# Patient Record
Sex: Female | Born: 1935 | Race: White | Hispanic: No | Smoking: Never smoker
Health system: Southern US, Community
[De-identification: ages and names within clinical notes are randomized; demographics above are authoritative.]

## PROBLEM LIST (undated history)

## (undated) DIAGNOSIS — M8580 Other specified disorders of bone density and structure, unspecified site: Secondary | ICD-10-CM

## (undated) DIAGNOSIS — E785 Hyperlipidemia, unspecified: Secondary | ICD-10-CM

## (undated) DIAGNOSIS — S22000A Wedge compression fracture of unspecified thoracic vertebra, initial encounter for closed fracture: Secondary | ICD-10-CM

## (undated) DIAGNOSIS — F0390 Unspecified dementia without behavioral disturbance: Secondary | ICD-10-CM

## (undated) DIAGNOSIS — C801 Malignant (primary) neoplasm, unspecified: Secondary | ICD-10-CM

## (undated) DIAGNOSIS — K8689 Other specified diseases of pancreas: Secondary | ICD-10-CM

## (undated) DIAGNOSIS — C4490 Unspecified malignant neoplasm of skin, unspecified: Secondary | ICD-10-CM

## (undated) DIAGNOSIS — I10 Essential (primary) hypertension: Secondary | ICD-10-CM

## (undated) HISTORY — PX: ABDOMINAL HYSTERECTOMY: SHX81

---

## 1997-11-27 ENCOUNTER — Other Ambulatory Visit: Admission: RE | Admit: 1997-11-27 | Discharge: 1997-11-27 | Payer: Self-pay | Admitting: *Deleted

## 1997-12-11 ENCOUNTER — Other Ambulatory Visit: Admission: RE | Admit: 1997-12-11 | Discharge: 1997-12-11 | Payer: Self-pay | Admitting: *Deleted

## 1997-12-13 ENCOUNTER — Other Ambulatory Visit: Admission: RE | Admit: 1997-12-13 | Discharge: 1997-12-13 | Payer: Self-pay | Admitting: *Deleted

## 1998-02-25 ENCOUNTER — Other Ambulatory Visit: Admission: RE | Admit: 1998-02-25 | Discharge: 1998-02-25 | Payer: Self-pay | Admitting: *Deleted

## 1998-08-22 ENCOUNTER — Other Ambulatory Visit: Admission: RE | Admit: 1998-08-22 | Discharge: 1998-08-22 | Payer: Self-pay | Admitting: *Deleted

## 1999-11-19 ENCOUNTER — Other Ambulatory Visit: Admission: RE | Admit: 1999-11-19 | Discharge: 1999-11-19 | Payer: Self-pay | Admitting: *Deleted

## 2000-07-05 ENCOUNTER — Encounter: Payer: Self-pay | Admitting: Internal Medicine

## 2000-07-05 ENCOUNTER — Ambulatory Visit (HOSPITAL_COMMUNITY): Admission: RE | Admit: 2000-07-05 | Discharge: 2000-07-05 | Payer: Self-pay | Admitting: Internal Medicine

## 2000-08-25 ENCOUNTER — Encounter (INDEPENDENT_AMBULATORY_CARE_PROVIDER_SITE_OTHER): Payer: Self-pay

## 2000-08-25 ENCOUNTER — Other Ambulatory Visit: Admission: RE | Admit: 2000-08-25 | Discharge: 2000-08-25 | Payer: Self-pay | Admitting: *Deleted

## 2000-09-28 ENCOUNTER — Encounter (INDEPENDENT_AMBULATORY_CARE_PROVIDER_SITE_OTHER): Payer: Self-pay | Admitting: Specialist

## 2000-09-28 ENCOUNTER — Encounter (INDEPENDENT_AMBULATORY_CARE_PROVIDER_SITE_OTHER): Payer: Self-pay

## 2000-09-28 ENCOUNTER — Inpatient Hospital Stay (HOSPITAL_COMMUNITY): Admission: RE | Admit: 2000-09-28 | Discharge: 2000-10-02 | Payer: Self-pay | Admitting: *Deleted

## 2001-02-01 ENCOUNTER — Other Ambulatory Visit: Admission: RE | Admit: 2001-02-01 | Discharge: 2001-02-01 | Payer: Self-pay | Admitting: *Deleted

## 2001-02-02 ENCOUNTER — Encounter: Admission: RE | Admit: 2001-02-02 | Discharge: 2001-02-02 | Payer: Self-pay | Admitting: *Deleted

## 2001-04-19 ENCOUNTER — Other Ambulatory Visit: Admission: RE | Admit: 2001-04-19 | Discharge: 2001-04-19 | Payer: Self-pay | Admitting: *Deleted

## 2001-04-26 ENCOUNTER — Encounter: Admission: RE | Admit: 2001-04-26 | Discharge: 2001-04-26 | Payer: Self-pay | Admitting: *Deleted

## 2001-07-28 ENCOUNTER — Other Ambulatory Visit: Admission: RE | Admit: 2001-07-28 | Discharge: 2001-07-28 | Payer: Self-pay | Admitting: *Deleted

## 2001-10-14 ENCOUNTER — Other Ambulatory Visit: Admission: RE | Admit: 2001-10-14 | Discharge: 2001-10-14 | Payer: Self-pay | Admitting: *Deleted

## 2002-04-12 ENCOUNTER — Other Ambulatory Visit: Admission: RE | Admit: 2002-04-12 | Discharge: 2002-04-12 | Payer: Self-pay | Admitting: *Deleted

## 2002-08-14 ENCOUNTER — Other Ambulatory Visit: Admission: RE | Admit: 2002-08-14 | Discharge: 2002-08-14 | Payer: Self-pay | Admitting: *Deleted

## 2003-02-27 ENCOUNTER — Other Ambulatory Visit: Admission: RE | Admit: 2003-02-27 | Discharge: 2003-02-27 | Payer: Self-pay | Admitting: *Deleted

## 2003-02-27 ENCOUNTER — Encounter: Admission: RE | Admit: 2003-02-27 | Discharge: 2003-02-27 | Payer: Self-pay | Admitting: *Deleted

## 2003-05-22 ENCOUNTER — Ambulatory Visit (HOSPITAL_COMMUNITY): Admission: RE | Admit: 2003-05-22 | Discharge: 2003-05-22 | Payer: Self-pay | Admitting: Gastroenterology

## 2003-09-04 ENCOUNTER — Other Ambulatory Visit: Admission: RE | Admit: 2003-09-04 | Discharge: 2003-09-04 | Payer: Self-pay | Admitting: *Deleted

## 2003-11-09 ENCOUNTER — Inpatient Hospital Stay (HOSPITAL_COMMUNITY): Admission: EM | Admit: 2003-11-09 | Discharge: 2003-11-12 | Payer: Self-pay | Admitting: Emergency Medicine

## 2004-10-08 ENCOUNTER — Inpatient Hospital Stay (HOSPITAL_COMMUNITY): Admission: EM | Admit: 2004-10-08 | Discharge: 2004-10-16 | Payer: Self-pay | Admitting: Emergency Medicine

## 2006-06-01 ENCOUNTER — Encounter: Admission: RE | Admit: 2006-06-01 | Discharge: 2006-06-01 | Payer: Self-pay | Admitting: Internal Medicine

## 2006-07-15 ENCOUNTER — Encounter: Admission: RE | Admit: 2006-07-15 | Discharge: 2006-07-15 | Payer: Self-pay | Admitting: Internal Medicine

## 2007-03-15 ENCOUNTER — Ambulatory Visit: Admission: RE | Admit: 2007-03-15 | Discharge: 2007-05-25 | Payer: Self-pay | Admitting: Radiation Oncology

## 2007-08-04 ENCOUNTER — Inpatient Hospital Stay (HOSPITAL_COMMUNITY): Admission: EM | Admit: 2007-08-04 | Discharge: 2007-08-08 | Payer: Self-pay | Admitting: Emergency Medicine

## 2008-01-22 ENCOUNTER — Emergency Department (HOSPITAL_COMMUNITY): Admission: EM | Admit: 2008-01-22 | Discharge: 2008-01-22 | Payer: Self-pay | Admitting: Emergency Medicine

## 2008-01-22 DIAGNOSIS — S22000A Wedge compression fracture of unspecified thoracic vertebra, initial encounter for closed fracture: Secondary | ICD-10-CM

## 2008-01-22 HISTORY — DX: Wedge compression fracture of unspecified thoracic vertebra, initial encounter for closed fracture: S22.000A

## 2008-02-01 ENCOUNTER — Inpatient Hospital Stay (HOSPITAL_COMMUNITY): Admission: EM | Admit: 2008-02-01 | Discharge: 2008-02-10 | Payer: Self-pay | Admitting: Internal Medicine

## 2008-02-01 ENCOUNTER — Encounter: Payer: Self-pay | Admitting: Emergency Medicine

## 2008-02-02 ENCOUNTER — Encounter (INDEPENDENT_AMBULATORY_CARE_PROVIDER_SITE_OTHER): Payer: Self-pay | Admitting: Internal Medicine

## 2008-03-28 ENCOUNTER — Encounter: Admission: RE | Admit: 2008-03-28 | Discharge: 2008-03-28 | Payer: Self-pay | Admitting: Neurosurgery

## 2008-05-23 ENCOUNTER — Encounter: Admission: RE | Admit: 2008-05-23 | Discharge: 2008-05-23 | Payer: Self-pay | Admitting: Neurosurgery

## 2010-12-23 NOTE — Discharge Summary (Signed)
NAME:  [name] [name], [name] [name]                  ACCOUNT NO.:  [id number]   MEDICAL RECORD NO.:  [id number]          PATIENT TYPE:  INP   LOCATION:  1401                         FACILITY:  [facility]   PHYSICIAN:  Gardiner Barefoot, MD    DATE OF BIRTH:  January 08, 1915   DATE OF ADMISSION:  08/04/2007  DATE OF DISCHARGE:                               DISCHARGE SUMMARY   Date of discharge is yet to be determined.   DISCHARGE DIAGNOSES:  1. Epigastric pain.  2. Right leg abrasion.  3. Chronic obstructive pulmonary disease.  4. Dementia.  5. Hypertension.  6. Hypercholesterolemia.  7. Diverticulosis.   DISCHARGE MEDICATIONS:  1. Atenolol 1 tablet 50 mg p.o. daily.  2. Norvasc 5 mg daily.  3. Lovastatin 20 mg daily.  4. Calcium 600 mg daily.  5. Vitamin C 500 mg daily.  6. Zinc 25 mg daily.  7. Temazepam 30 mg p.o. nightly p.r.n.  8. Aldara 5% cream to forehead every other night.  9. Fluocinonide ointment/cream a one-to-one mix topically to legs      daily.  10.Triamsinolone 1%/ 120 g, apply to arms daily.   HISTORY OF PRESENT ILLNESS:  Please see dictated history and physical  from admitting physician.  Briefly, this 77 year old female who had  presented with squeezing epigastric pain that had lasted approximately 1  hour.  The patient reported she had never had that before, and it was  associated with nausea and vomiting.  The pain had resolved  spontaneously.  She never had the pain before.  There was no fever other  associated symptoms.   HOSPITAL COURSE:  1. Epigastric pain.  The patient was evaluated with a CT scan, which      did not suggest any etiology of the epigastric pain.  She also had      initial cardiac enzymes in the emergency room with the troponin at      0.04 and a CK-MB of 10.5 with a CK of 326.  Cardiology saw the      patient in the emergency room, and did not feel any intervention      was necessary at that time.  The patient's cardiac enzymes were      cycled, and  troponin remained within normal limits, 0.04, as well      as CK-MB which rose to 13.5, however, was trending down at      discharge.  Discussed with the patient's son at length the findings      including therapy for cardiac disease which includes the      medications she is on including atenolol, aspirin, and Zocor, and      that she should be continued with that.  As the patient did not      have any EKG changes or further chest pain, or for dysrhythmias      during her hospitalization. no indication for further management.      The patient's son was advised will to medically continue to manage      the patient with blood pressure medications, aspirin and lipid  therapy.  Due to the patient's age and comorbid dementia, do not      think the patient would benefit from cardiac catheterization.  Feel      the risk would out-weigh the benefit, and therefore, no stress test      was undertaken.  However, this can be readdressed if the patient      does have a return of any chest pain or any other concerns.  The      patient denied ever having any chest pain during the      hospitalization or before.  The patient does though report of a      history of occasional pain under her left breast that lasted a few      seconds and was sharp.  This does not suggest a cardiac etiology.  2. Hypothyroidism.  The patient's TSH was checked and was 2.093, and      the patient was continued on her home medications.  3. Hypertension.  The patient was continued on her home medications,      and would have a mildly elevated blood pressure in the a.m. prior      to her a.m. pain medication doses, but otherwise was normotensive.  4. Dyslipidemia.  The patient's fasting lipid profile was checked.      Her LDL was 83, with an HDL of 75, and triglycerides of 43.  She is      at goal.  5. A fall.  The patient did have a fall during her hospitalization,      and had a small abrasion of her right shin at the site  of where she      had a blood blister.  The patient did have some bleeding, required      a dressing on her lesion.  She likely will need dressings over the      next 1-2 weeks at her assisted living location; we will have that      arranged prior to discharge.  6. Dermatitis.  The patient was continued with her home topical      therapy.  7. History of endometrial cancer.  The patient is status post a total      abdominal hysterectomy and oophorectomy.      Gardiner Barefoot, MD  Electronically Signed     RWC/MEDQ  D:  08/07/2007  T:  08/08/2007  Job:  [id number]

## 2010-12-23 NOTE — H&P (Signed)
NAME:  [name] [name]                  ACCOUNT NO.:  [id number]   MEDICAL RECORD NO.:  [id number]          PATIENT TYPE:  EMS   LOCATION:  ED                           FACILITY:  [facility]   PHYSICIAN:  [name] [name], M.D.DATE OF BIRTH:  1915/04/03   DATE OF ADMISSION:  08/04/2007  DATE OF DISCHARGE:                              HISTORY & PHYSICAL   HISTORY OF PRESENT ILLNESS:  This is a 77 year old very pleasant lady  who had an episode of epigastric squeezing type of pain which lasted 1  hour earlier today, approximately 12 hours ago.  The pain was associated  with nausea and vomiting and according to the family members some  confusion.  After the pain had resolved.  She still continued to have  some nausea and therefore was eventually brought to the emergency room.  She has no history of coronary artery disease nor has she had a stroke.   PAST SURGICAL HISTORY:  Total abdominal hysterectomy and bi salpingo-  oophorectomy in 2002 for endometrial cancer.  She has a history of  melanoma, which has been locally excised on the nose and forehead.   PAST MEDICAL HISTORY:  Right pubic ramus fracture March 2006,  hypertension, hypercholesterolemia on medication, chronic obstructive  pulmonary disease without need for oxygen, diverticular disease on  colonoscopy in 2004.   SOCIAL HISTORY:  She is a widow and now lives at a friend's home [place].  She is a nonsmoker.  She occasionally drinks alcohol.   MEDICATIONS:  Atenolol 50 mg daily, lovastatin 20 mg daily, Norvasc 5 mg  daily, Bactrim recently started by her dermatologist 1 tablet  twice a  day for further 5 days, Temazepam 30 mg at bedtime, and zinc sulfate 1  tablet daily.   ALLERGIES:  Actonel, Celebrex, Fosamax, Niacin and Quinidine.   FAMILY HISTORY:  Noncontributory.   REVIEW OF SYSTEMS:  Apart from the symptoms mentioned above there are no  other symptoms referable to all systems reviewed.   PHYSICAL EXAMINATION:  VITAL SIGNS:   Temperature 98.2, blood pressure  120/57, pulse 73, respiratory rate 12 to 14, saturation 96%.  GENERAL:  She looks systemically well and has not had any acute distress  or pain at the present time.  CARDIOVASCULAR:  Heart sounds are present  and normal without any murmurs or gallop rhythm.  RESPIRATORY:  Lung fields are clear with poor air entry in both lung  fields but there is no wheezing or crackles heard.  ABDOMEN:  Abdomen is soft but it is tender in the epigastric area and  there is a sense of fullness in this area.  There is no  hepatosplenomegaly.  NEUROLOGICAL:  She is alert and oriented with no focal neurological  signs.  She does not appear to be confused at the present time.   LABORATORY DATA:  Hemoglobin 13.4, white blood cell count 10.0,  platelets 249.  Sodium 131, potassium 4.3, bicarbonate 28, glucose 112,  BUN 14, creatinine 0.98,  lipase 22.  Cardiac enzymes show an elevated  CK of 326 with an elevated CK-MB of 10.5.  Troponin is 0.04.  Electrocardiogram shows normal sinus rhythm and no acute ST-T wave  changes.  Chest x-ray shows chronic obstructive pulmonary disease with  no acute cardiopulmonary disease.   IMPRESSION:  1. Epigastric pain, etiology unclear, possibly cardiac, possibly      related to stomach pathology.  2. Elevated CK-MB, unclear significance.  3. Hypertension.  4. Chronic obstructive pulmonary disease, stable.  5. Hypercholesterolemia.  6. Possible mild underlying dementia per family's history.   PLAN:  1. Admit.  2. CT of the abdomen and pelvis.  3. Serial cardiac enzymes.  4. Cardiology has been consulted and they will see the patient.  5. Further recommendations will depend on the patient's hospital      progress.      [name] [name], M.D.  Electronically Signed     NCG/MEDQ  D:  08/04/2007  T:  08/04/2007  Job:  [id number]   cc:   Candyce Churn. Allyne Gee, M.D.  Fax: [phone]

## 2010-12-23 NOTE — Discharge Summary (Signed)
NAME:  [name] [name], [name] [name]                  ACCOUNT NO.:  [id number]   MEDICAL RECORD NO.:  [id number]         PATIENT TYPE:  LINP   LOCATION:  1418                         FACILITY:  [facility]   PHYSICIAN:  Hind I Elsaid, MD      DATE OF BIRTH:  Dec 27, 1914   DATE OF ADMISSION:  02/01/2008  DATE OF DISCHARGE:                               DISCHARGE SUMMARY   DISCHARGE DIAGNOSES:  1. Unstable acute T11 vertebral body fracture.  2. Bilateral moderate pleural effusion, felt to be secondary to      congestive heart failure, resolved.  3. Hypertension.  4. Chronic obstructive pulmonary disease.  5. E. coli urinary tract infection.  6. Leukocytosis.  7. Abnormal CT abdominal findings with possibility of mucinous tumor      of the body of the pancreas.  8. Constipation, resolved.  9. Hyperlipidemia.  10.Dementia.   MEDICATIONS:  To be dictated at the date of discharge.   CONSULTATIONS:  1. Neurosurgery consulted, done by Dr. Phoebe Perch from [facility].  2. Gastroenterology consulted for abnormal CT abdomen, done by Dr.      Jeani Hawking.  3. Physical therapy, speech and swallow also consulted for evaluations      of her feeding.   PROCEDURE:  1. CT abdomen and pelvis did show acute fracture of T11 vertebral      body.  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      Slight dilatation of the bile duct is without a particular site of      obstruction.  Extensive stool in the colon.  Moderate bilateral      effusion.  No acute abnormality of the pelvis or pelvic fracture.  2. Chest x-ray:  Layering bilateral effusion with overlying      atelectasis.  3. X-ray of the hip negative for fracture.  4. MRI of the thoracic spine:  Acute T11 vertebral body fracture.  The      fracture plane is filled with blood products.  Recommend MRI.      Acute T11 vertebral body fracture.  There is mild posterior      angulation and displacement of the posterior vertebral body  fragment with blood product in the fracture cleft.  Diffuse      thoracic interbody spine and fusion is seen, which could reflect      diffuse idiopathic skeletal hyperostosis.  Recommend neurosurgery      orthopedics for evaluation.  Moderate bilateral pleural effusion.  5. Ultrasound of the abdomen:  Abnormal appearance of the gallbladder.      A 1 cm pancreatic polycystic structure.  Bilateral pleural      effusion, small amount of ascites.  6. Chest x-ray:  COPD and improved aeration with a mild degree of      pleural effusion.  Coarse interstitial opacities are chronic.  The      heart is normal.  7. A 2D echo:  Ejection fraction 60-65%.  There were no left      ventricular regional wall  motion abnormalities.Left ventricular end      diastolic function was normal.   HISTORY OF PRESENT ILLNESS:  This is a 77 year old female with a history  of hypertension, hyperlipidemia, who had a fall 7-10 days in a  restaurant.  Complained of low back pain.  Apparently, she had an x-ray  outpatient, and they did not recommend any acute fracture.  She came to  the emergency room, found to have a CT of the abdomen and pelvis, which  showed evidence of acute T11 compression fracture along with moderate  bilateral effusion and __________ over the pancreas.  Patient was  admitted for further evaluation and pain management.   PROBLEMS:  1. Patient admitted with intractable back pain, acute compression      fracture over the vertebra.  Patient was started on Dilaudid for      pain control and oxycodone IR.  The patient had a CT scan of the T-      spine, which , as above.  For that, neurosurgery consultant by Dr.      Phoebe Perch.  He recommended complete immobilization of the spine for 4-      8 weeks and recommended head of bed 0-10 only but may reverse      Trendelenburg bed slightly, plus log-roll only.  He recommended      decubitus and DVT prophylaxis.  The immobilization team will      continue for  4 to8 weeks.  He recommended a repeat CT of the      thoracic spine within 6-8 weeks to reassess, and he will follow up      as an outpatient her CT scan.  It is a really very difficult      situation this unlucky patient went through.  Accordingly, patient      remained under above immobilization precautions.  The feeding      remained an issue secondary to very high risk of aspiration.  On      close discussion to the family, the family agreed with comfort      feedings, despite the risk of aspiration.  For that, swallow and      speech did evaluate the patient.  Family agrees with comfort p.o.      intake, only when asking for p.o. and when fully alert.  As said,      the patient did not receive the goal of p.o. feeding secondary to      really high risk of aspiration.  Our plan is to reach to the final      recommendation from swallow and speech.  Possible multiple attempts      to feed snacks during the day when the patient is awake and alert.      In addition to that, will ask Dr. Phoebe Perch to evaluate the patient,      and if there is any further recommendation to be addressed before      the patient is discharged to a nursing home.  2. Bilateral moderate pleural effusion:  Patient is started on IV      Lasix with good urine output.  Repeat chest x-ray did show complete      resolution of her moderate bilateral effusion.  At this time, the      patient did not require any thoracentesis.  The patient breathing      above 90, 4% on room air, and she did not require any form of  noninvasive oxygen measures.  3. E. coli UTI:  Patient on day 3 of Rocephin.  4. Constipation, which has resolved with laxative measures.  5. Leukocytosis:  Remains fluctuating from the date of admission but      the number remains stable.  We felt that can be followed as an      outpatient.  6. Elevated D-dimer:  The patient is not orthostatic, in distress, or      shortness of breath.  The patient has no  symptoms of chest pain at      this time.  We felt the high D-dimer is most probably secondary to      the fracture.  Less likely secondary to pulmonary embolism and      secondary to the patient's immobilization.  We did not pursue any      CT angio at this time.  7. Abnormal pancreatic mass:  Dr. Elnoria Howard did evaluate the patient and      recommended outpatient followup after resolution of her acute      problem.  Her family informed about this.  Also, Dr. Elnoria Howard did not      think there was any rush to perform above endoscopic ultrasound      with FNA.  He recommended outpatient followup within three months.   DISPOSITION:  Patient will be discharged to a nursing home.  We will ask  Dr. Phoebe Perch to re-evaluate the patient again tomorrow for his final  recommendation.  We will wait for the final swallow and speech  recommendations.  The family informed about the complications of  immobilization, which include pneumonia, DVT, PE, and decubitus ulcer.      Hind Bosie Helper, MD  Electronically Signed    HIE/MEDQ  D:  02/07/2008  T:  02/07/2008  Job:  [id number]

## 2010-12-23 NOTE — Consult Note (Signed)
NAME:  [name] [name], [name] [name]                  ACCOUNT NO.:  [id number]   MEDICAL RECORD NO.:  [id number]          PATIENT TYPE:  INP   LOCATION:  1418                         FACILITY:  [facility]   PHYSICIAN:  Jordan Hawks. Elnoria Howard, MD    DATE OF BIRTH:  November 08, 1914   DATE OF CONSULTATION:  02/07/2008  DATE OF DISCHARGE:                                 CONSULTATION   REASON FOR CONSULTATION:  Possible pancreatic neoplasm.   REFERRING PHYSICIAN:  [facility].  This is an unassigned  patient.   HISTORY OF PRESENT ILLNESS:  This is a 77 year old female with past  medical history of hypertension, hyperlipidemia, COPD, dermatitis and  dementia who was admitted to the hospital with a T11 fracture.  The  patient apparently suffered a fall 7-10 days prior to her admission and  complained of having of persistent back pain.  However, x-ray studies  were not indicative of any fracture at that time.  However, because of  her repeated visits to the ER, a CT scan was performed, and it was noted  that she had a T11 fracture.  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  The patient did complain of having some epigastric pain with some  radiation tobacco.  Otherwise, prior to her fall, she had no complaints  of any types of pain, and per the family's report, she was asymptomatic.   PAST MEDICAL AND SURGICAL HISTORY:  As stated above.   FAMILY HISTORY:  Noncontributory.   SOCIAL HISTORY:  Negative for alcohol, tobacco, illicit drug use.  She  lives in a nursing home.   ALLERGIES:  TO QUININE, NIACIN, VERAPAMIL, VICODIN, MORPHINE, SENOKOT,  CELEBREX, MASOPROCOL.   HOSPITAL MEDICATIONS:  1. Vitamin C.  2. Atenolol.  3. Ceftriaxone.  4. Lovenox.  5. Lasix.  6. Protonix.  7. Zocor.  8. Zinc sulfate.  9. Ventolin.  10.Percocet.  11.Darvocet.   REVIEW OF SYSTEMS:  Unable to obtain as she is having some altered  mental status  secondary to the pain medications.   PHYSICAL EXAMINATION:  VITAL SIGNS:  Blood pressure is 142/73, heart  rate is 68, respirations 20, temperature is 97.4.  GENERAL:  The patient appears not in any acute distress; however,  cognitively she appears to be affected from the pain medications.  HEENT:  Normocephalic, atraumatic.  NECK:  Appears to be supple.  No lymphadenopathy.  LUNGS:  Clear to auscultation bilaterally.  CARDIOVASCULAR:  Regular rhythm.  ABDOMEN:  Is flat, soft, some epigastric tenderness.  No rebound or  rigidity.  Positive bowel sounds.  EXTREMITIES:  No clubbing, cyanosis or edema.   LABORATORY VALUES:  White blood cell count 14.2, hemoglobin 14.6,  platelets 295.  Sodium 136, potassium 3.7, chloride 96, CO2 27, glucose  113, BUN 28, creatinine 0.8.   IMPRESSION:  1. 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  2. T11 fracture.   I have discussed the situation in detail with her family, and at this  time, no acute GI intervention is required as she has an  unstable T11  fracture.  Apparently, she is not amendable to having surgical repair of  this issue, and therefore, bedrest has been ordered, and hopefully, her  vertebrae can heal to some extent.  There is also talk of using a back  brace.  Unfortunately, she does have some epigastric tenderness, and I  was wondering if an EUS with FNA will need to be done sooner rather  later.  However, at this time, I will have the patient follow-up with me  in 3 months.  However, further systems can be rendered if deemed  necessary, and my business card was provided to the family.  Additionally, if the patient is able to be stabilized with a back brace,  then EUS can be done at a sooner date.  At this time, I will hold off on  any GI intervention and can be called as needed.      Jordan Hawks Elnoria Howard, MD  Electronically Signed     PDH/MEDQ  D:  02/07/2008  T:  02/07/2008  Job:  [id number]

## 2010-12-23 NOTE — Discharge Summary (Signed)
NAME:  [name] [name], [name] [name]                  ACCOUNT NO.:  [id number]   MEDICAL RECORD NO.:  [id number]          PATIENT TYPE:  INP   LOCATION:  1401                         [facility]ACILITY:  [facility]   PHYSICIAN:  Lucita Ferrara, MD         DATE OF BIRTH:  04-20-1915   DATE OF ADMISSION:  08/04/2007  DATE OF DISCHARGE:                               DISCHARGE SUMMARY   ADDENDUM:  I am going to go ahead and discontinue her Lovastatin given  the increase in CK and her being on this medicine.   ISSUES TO BE ADDRESSED:  1. Patient is to follow up the CKs to make sure that they are trending      down.  Currently, it is trending down here.  2. She is to follow up in regards to her cognitive decline which has      been moderate at this point and puts her at risk for      a.     Falls.      b.     Aspiration.   As she transitions to the skilled nursing facility at [facility], she should be on observation for fall precautions,  aspirations precautions.  Head of the bed should be at 45 degrees and  also the bed should be molded.  She should have weekly CK levels and  this information should be given to Murtis Sink or the physician at  the skilled nursing facility.  I had a long discussion with the family  in regards to perhaps starting Aricept for her cognitive decline which  she has never been on. This is probably a good recommendation, but needs  to be monitored as far as side effects and gastrointestinal side effects  especially.      Lucita Ferrara, MD  Electronically Signed     RR/MEDQ  D:  08/08/2007  T:  08/08/2007  Job:  [id number]   cc:   Murtis Sink, M.D.  [place], [place]

## 2010-12-23 NOTE — H&P (Signed)
NAME:  [name] [name], [name] [name]                  ACCOUNT NO.:  [id number]   MEDICAL RECORD NO.:  [id number]          PATIENT TYPE:  INP   LOCATION:  1418                         FACILITY:  [facility]   PHYSICIAN:  Eduard Clos, MDDATE OF BIRTH:  1915-03-21   DATE OF ADMISSION:  02/01/2008  DATE OF DISCHARGE:                              HISTORY & PHYSICAL   History obtained from patient, physician, and patient's daughter.   CHIEF COMPLAINT:  Mid back pain.   HISTORY OF PRESENT ILLNESS:  This is a 77 year old female with history  of hypertension, dermatitis, hyperlipidemia, COPD who presented to the  ER with complaints of increasing mid back pain.  The patient had a fall  7-10 days ago in a restaurant, whereon she was taken to the ER.  At that  time the patient had complained of low back pain and x-rays of her  lumbosacral spine were done and once pain was controlled and x-rays  showing no acute factors, was discharged back to assisted living  facility.  After being discharged she started feeling the same again on  the second day.  The patient was placed on Vicodin since discharge from  the ER.  The Vicodin was continued, but despite great pain, was  increasing at this time.  The pain was more in the mid back.  At the  first,per patient's daughter, the patient had x-ray of the whole spine.  This did not show any acute fracture.  The patient was continued on pain  medications, despite which the patient did not improve, and at this time  the patient also was found to be constipated and the patient was  transferred to the ER.  At the ER the patient had a CAT scan of the  abdomen and pelvis, which showed an acute T-11 compression fracture  along with moderate bilateral pleural effusion and mucinous tumor of the  pancreas.  The patient was admitted for further evaluation and  management.  The patient still has some pain in the mid back area which  is controlled largely with her medication.  The  patient has not moved  her bowels for some days now.  She told that she was given some enema at  the  with nonpainful results.  In addition the patient was also found to  be hypoxic at this time.  She is being maintained at 35% Venti mask for  maintaining more than 90.  The patient denies any chest pain, shortness  of breath,  dizziness, loss of function, weakness of limbs.   PAST MEDICAL HISTORY:  1. Hypertension.  2. Hyperlipidemia.  3. Dermatitis.  4. COPD.  5. Dementia.   PAST SURGICAL HISTORY:  She has had a hysterectomy.   MEDICATIONS PRIOR TO ADMISSION:  Per daughter:  1. Atenolol 75 mg.  2. Norvasc was discontinued.  3. She takes two creams, one for her hand and another one for her leg.      The one for her hand, per the patient's daughter was triamcinolone.  4. She also takes Zocor.  5. Vitamin C.  6. Zinc.  ALLERGIES:  1. The patient is allergic to quinidine.  2. QUININE.  3. NIACIN.  4. VERAPAMIL.  5. MASOPROCOL.  6. VICODIN.  7. MORPHINE SULFATE.  8. SENOKOT.  9. CELEBREX.   SOCIAL HISTORY:  The patient did not smoke cigarettes, drinks alcohol  socially.   FAMILY HISTORY:  Noncontributory.   REVIEW OF SYSTEMS:  As in history of present illness.   PHYSICAL EXAMINATION:  GENERAL:  Not in acute distress.  VITAL SIGNS:  Blood pressure 150/70, pulse 80 per minute, temperature  97.4, respirations 18 per minute, O2 saturation 94% on 35% mask.  HEENT:  Anicteric, no pallor.  CHEST:  Bilaterally clear to auscultation, no rhonchi, no crepitation.  HEART:  S1 and S2 heard.  ABDOMEN:  Soft, mildly distended.  No guarding normal active bowel  sounds.  NEUROLOGIC:  Alert, awake, oriented to time, place, and person.  Moves  upper and lower extremities.  EXTREMITIES:  Peripheral pulses felt.  The patient on trying to move,  she has symptoms of pain in the mid back.  At rest her pain is largely  controlled.   LABORATORY DATA:  CT of abdomen and pelvis shows active  fracture of T11  vertebral body.  There is a 13 mm mucinous tumor of the body of the  pancreas, slight dilatation of the bile ducts without any discreet site  of obstruction.  Extensive tumor in the colon.  Moderate bilateral  pleural effusion.   CBC:  WBC is 14.4, hemoglobin 15.5, hematocrit 45.5, platelets 233,  neutrophils 85%.  Complete metabolic panel:  Sodium 138, potassium 4,  chloride 88, bicarbonate 31, glucose 135, BUN 14, creatinine 0.7.  Alkaline phosphatase 147, total bilirubin 1.2, AST 56, ALT 31, total  protein 7.4, albumin 4.4, calcium 9.7, lipase 52.  UA:  Positive  ketones, negative for nitrites, leukocytes, blood, and bilirubin.  Glucose levels are negative.   ASSESSMENT:  1. Mid back pain with acute T11 compression fracture.  2. Mucinous tumor of the pancreas.  3. Hyponatremia, probably from fluid overload.  4. Moderate bilateral pleural effusion.  5. Constipation.  6. Chronic obstructive pulmonary disease.  7. Hypertension.  8. Dementia.   PLAN:  Admit patient to telemetry.  Will check BNP and D-dimer.  Will  get an MRI of the thoracic spine.  Will place patient on IV Lasix due to  bilateral pleural effusion.  Will recheck a complete metabolic panel,  CBC, TSH,  and lipid panel in the a.m.  The patient will need a neuroradiologist  for her T11 compression fracture evaluation and also a GI evaluation for  a mucinous tumor of the pancreas.  I have discussed with the patient's  daughter in detail about the patient's condition and plan.  Further  recommendations as the patient condition evolves.      Eduard Clos, MD  Electronically Signed     ANK/MEDQ  D:  02/01/2008  T:  02/02/2008  Job:  [id number]

## 2010-12-23 NOTE — Discharge Summary (Signed)
NAME:  [name] [name], [name] [name]                  ACCOUNT NO.:  [id number]   MEDICAL RECORD NO.:  [id number]          PATIENT TYPE:  INP   LOCATION:  1418                         FACILITY:  [facility]   PHYSICIAN:  Isidor Holts, M.D.  DATE OF BIRTH:  1914/08/30   DATE OF ADMISSION:  02/01/2008  DATE OF DISCHARGE:  02/10/2008                               DISCHARGE SUMMARY   ADDENDUM:   PRIMARY MEDICAL DOCTOR:  Robyn N. Allyne Gee, M.D.   DISCHARGE DIAGNOSES:  Refer to interim Discharge Summary dictated February 07, 2008 by Dr. Suzzanne Cloud.   CONSULTATIONS:  1. Clydene Fake, M.D., Neurosurgeon.  2. Jordan Hawks. Elnoria Howard, MD. GI.   For procedures, admission history, detailed clinical course, refer to  above-mentioned interim summary.   DISCHARGE MEDICATIONS:  1. Tenormin 25 mg p.o. daily.  2. Zocor 40 mg p.o. q.h.s.  3. Ascorbic acid 500 mg p.o. daily.  4. Triamcinolone 0.1% cream applied topically to affected areas of the      skin once daily.  5. Zinc sulfate 220 mg p.o. daily.  6. Protonix 40 mg p.o. daily.  7. Lexapro 10 mg p.o. daily.  8. Lovenox 40 mg subcutaneously daily.  9. Albuterol 2.5 mg /Atrovent 500 mcg bronchodilator nebulizers p.r.n.      q.4-6 hours.  10.Darvocet N 100 one p.o. p.r.n. q.6 hourly.   DISPOSITION:  For the period from February 08, 2008 to February 09, 2008, the  patient's clinical condition remained stable and pain was well-  controlled.  She had no clinical evidence of congestive heart failure  and, as a matter of fact, chest x-ray of February 02, 2008 showed COPD, but  no current pneumonia or congestive heart failure.  There was a bump in  renal indices, with BUN of 56 and creatinine of 1.08 on February 09, 2008.  Diuretics have therefore been discontinued  and the patient was  commenced on gentle intravenous fluid hydration with half normal saline.  She was on day #5 of intravenous Rocephin on February 09, 2008, and the plan  is to discontinue this on February 10, 2008 after completion of  a 6-day  course.  There have been no problems referable to the patient's  pancreatic lesion.  She is scheduled to followup with Dr. Jeani Hawking  in 3 months' time, on an outpatient basis.  The patient has tolerated a  D1 diet with nectar-thick liquids and has been asymptomatic from the  point of view of COPD.  Blood pressure was adequately controlled at  106/71 on February 09, 2008.  I did have an extensive discussion with Dr.  Phoebe Perch, Neurosurgeon, on February 09, 2008.  He has recommended that the  patient remain on bedrest for a period of 3 to 6 months.  He  emphatically does not recommend a back brace. He would like to see the  patient again in 6 to 8 weeks with repeat CT scan, to reevaluate.  At  this point, the patient is considered clinically stable for discharge to  be contemplated, and provided no acute problems arise in the interim,  she will be discharged on February 14, 2008.   DIET:  D1 dysphagia diet with nectar-thick liquids.   ACTIVITY:  Bed rest for 3 to 6 months.  May assume Trendelenburg  position.  Otherwise, head of bed elevation no higher than 0 to 10  degrees.   FOLLOW-UP INSTRUCTIONS:  The patient is to followup routinely with her  primary MD, Dr. Dorothyann Peng per prior scheduled appointment.  She is  also to followup with Dr. Phoebe Perch, Neurosurgeon in 6 to 8 weeks.  According to Dr. Phoebe Perch, an appointment has already been scheduled.  She  is to have repeat CT scan of her thoracic spine just prior to that  appointment.  In addition, the patient should follow up with Dr. Jeani Hawking, Gastroenterologist, in 3 months.  Her family has been supplied  with the appropriate information.      Isidor Holts, M.D.  Electronically Signed     CO/MEDQ  D:  02/09/2008  T:  02/09/2008  Job:  [id number]

## 2010-12-26 NOTE — Discharge Summary (Signed)
[facility]  Patient:    [name] [name], [name] [name]                 MRN: [id number] Adm. Date:  [id number] Disc. Date: [id number] Attending:  Marin Comment CC:         [name] [name], M.D.   Discharge Summary  REASON FOR ADMISSION:  Endometriosis carcinoma for total abdominal hysterectomy and bilateral salpingo-oophorectomy.  DISCHARGE DIAGNOSIS:  Stage IB adenocarcinoma of the endometrium.  OPERATIVE PROCEDURES: 1. Total abdominal hysterectomy. 2. Bilateral salpingo-oophorectomy. 3. Exploratory laparotomy.  CONDITION AT THE TIME OF DISCHARGE:  Stable and improved.  HISTORY OF PRESENT ILLNESS:  For details of the patients admission history and physical, please see the transcribed note dated September 28, 2000. Briefly, this patient is 77 years old.  She has never been on hormone therapy. She presented to our office in referral from [name] [name], M.D., because of a single episode of postmenopausal bleeding.  Endometrial biopsy showed fragments of atypical papillary epithelium felt to be consistent with carcinoma.  She was placed on Megace and her bleeding stopped.  She is now admitted for hysterectomy.  HOSPITAL COURSE:  [name] [name] received a full bowel prep on the night before her surgery and was admitted on the day of surgery and taken to the operating room where exploratory laparotomy, total abdominal hysterectomy, and bilateral salpingo-oophorectomy were performed.  The frozen section diagnosis on the patients operative specimen showed an endometrial carcinoma which was felt to be grade 3, but less than 50% myometrial invasion.  Peritoneal washings were obtained at the time of the procedure.  The patients estimated blood loss was 400 cc.  On the evening of surgery, the patient was doing well.  Her pain was controlled with PCA morphine.  She was groggy, but conversant.  On postoperative day #1, the patients urine output was sluggish.  It was  also dark in color and remained dark yellow through the remainder of her hospitalization even after diuresis began.  On the morning of postoperative day #1, the patients Foley catheter was removed.  She was urged to ambulate. At that time, her bowel sounds were already active and hemoglobin was stable at 12.4.  She was begun on sips of liquid.  She had some mild nausea and remained just on small sips of water during this stay.  That evening, she got very little sleep and on the morning of postoperative day #2 had the blues. She felt primarily that this was due to her lack of sleep.  She tried some clear liquids, but continued to have nausea.  On that day, her abdomen was slightly distended with hyperactive bowel sounds.  She increased her ambulation.  I was contacted that day with complaints of sluggish urine output.  She was catheterized and did not have a high residual.  We saturation tight in terms of her fluid status and she began to pick up her urine output on her own.  On the evening of postoperative day #2, the patient had marked increase in her blood pressure.  Her blood pressures had been normotensive and even low on the hours of vital signs leading up to this time.  The values were 118/59 and 133/66.  On the evening in which I was contacted, her blood pressure was 200/110.  At that time, she received a single dose of labetalol and then a dose of hydrochlorothiazide.  Her propranolol was given approximately an hour after the labetalol was given.  Her blood pressure  was normal on the morning following this episode and her blood pressure medications were able to be resumed.  Her temperature maximum for postoperative day #2 was 100.2 degrees, but this was the only temperature over 100 degrees during her hospitalization.  On the day of postoperative day #3, she began to take a full liquid diet and this was tolerated well.  She was advanced to a bland diet and then a regular diet on the  morning of postoperative day #4.  On the evening of postoperative day #3, she had a normal bowel movement and another one on postoperative day #4.  Since discontinuing her morphine, she has not been using anything for pain medication.  She has been using her spirometer well.  On the morning of postoperative day #4, the patient was sitting up, had lipstick on, and had eaten a regular breakfast with bacon and eggs.  Urine output was picking up with 1200 cc over the last shift.  Her examination showed a very modest amount of staining on her abdominal dressing.  Her abdomen was still hyperactive.  The incision otherwise looked good.  The hemoglobin was stable at 11.9.  A decision was made to ask the patient to eat lunch and if she tolerated her lunch well to be discharged.  She received a [employer] [facility] discharge instruction sheet, which I reviewed with her carefully.  She will come to my office on Monday to have her staples removed. Her daughter-in-law is a Orthoptist at [employer]. [facility]. She will be given a staple remover and if her daughter-in-law was to remove her staples rather than having her come to the office, that is fine too.  CONDITION AT THE TIME OF DISCHARGE:  Stable and improved. DD:  10/02/00 TD:  10/04/00 Job: [id number]

## 2010-12-26 NOTE — Discharge Summary (Signed)
NAME:  [name], [name]                  ACCOUNT NO.:  [id number]   MEDICAL RECORD NO.:  [id number]          PATIENT TYPE:  INP   LOCATION:  0454                         FACILITY:  [facility]   PHYSICIAN:  Hettie Holstein, D.O.    DATE OF BIRTH:  Mar 07, 1915   DATE OF ADMISSION:  10/08/2004  DATE OF DISCHARGE:                                 DISCHARGE SUMMARY   ADMISSION DIAGNOSIS:  Acute pelvic fracture, status post fall.   DISCHARGE DIAGNOSES:  1.  Acute right pubic ramus fracture, status post evaluation of Dr. Turner Daniels of      orthopedics with a recommendation for walker and weightbearing as      tolerated and use of a walker for the next six weeks.  2.  Hypertension, controlled.  3.  Chronic obstructive pulmonary disease, stable.  4.  Osteoporosis.  5.  Hyponatremia, felt to be secondary to hydrochlorothiazide in addition to      pain associated with hip fracture.  6.  Mildly elevated liver function tests.  The patient is on Crestor at      home.  Will check an ultrasound prior to discharge.  If this cannot be      coordinated prior to discharge, then recommend that this be done in the      outpatient setting.  We are holding a request at this time.  Recommend      follow-up LFTs within the next couple of weeks.   MEDICATIONS ON TRANSFER:  Patient should continue her calcium supplement 600  mg b.i.d. as before, vitamin C daily, in addition to her Prevacid as before,  which is 30 mg daily.  Hold her HCTZ and triamterene for now.  Continue her  atenolol 25 mg daily.  Continue prophylaxis until she is more mobile and  ambulatory with Lovenox 30 mg subcu every 24 hours and calcitonin 1 spray in  alternating nostrils daily, Senokot twice daily for laxative but hold if the  development of diarrhea, laxative 1-2 tablets q.i.d. p.r.n. pain.   HISTORY OF PRESENT ILLNESS:  For full details, please see H&P as dictated by  Dr. Corky Downs; however, [name] [name] is a pleasant 77 year old Caucasian female  with a  history of hypertension and hypercholesterolemia with an accidental  fall on Monday, in which she tripped over carpet while going to the store.  She developed pain on the right side of her pelvis and was taken to the  urgent care center.  She had an x-ray of the pelvis, which did not show any  conclusive fracture.  She later saw Dr. Turner Daniels in the office on Tuesday, and  she was scheduled to have an MRI of the pelvis previously.  Unfortunately,  she could not keep the appointment because she started throwing up after  taking Vicodin.  She could not keep anything down; hence, she presented to  the emergency department for evaluation.  She had experienced hallucinations  while taking Vicodin and was given morphine in the emergency department and  had a brief episode of confusion.   HOSPITAL COURSE:  Patient was admitted  for pain control and management.  Tramadol was attempted for pain management.  In addition, she was evaluated  by Dr. Turner Daniels.  She was noted to have a right pubic ramus fracture and  recommendations for walker use and skilled nursing facility placement or  assisted living per orthopedic service in addition to therapy services here  at [facility].  She was noted to have some mildly elevated LFTs.  It was  noted  that she had been on Crestor in the past.  Please refer to the dictation  above.  We are ordering an ultrasound and holding her hydrochlorothiazide in  regards to her hyponatremia at this time.  We are awaiting her final  disposition pending bed availability.      ESS/MEDQ  D:  10/13/2004  T:  10/13/2004  Job:  [id number]   cc:   Dr. Arlyce Dice

## 2010-12-26 NOTE — Discharge Summary (Signed)
NAME:  [name] [name], [name] [name]                            ACCOUNT NO.:  [id number]   MEDICAL RECORD NO.:  [id number]                   PATIENT TYPE:  INP   LOCATION:  5009                                 FACILITY:  [facility]   PHYSICIAN:  Della Goo, M.D.              DATE OF BIRTH:  1914/09/07   DATE OF ADMISSION:  11/09/2003  DATE OF DISCHARGE:  11/12/2003                                 DISCHARGE SUMMARY   DISCHARGE DIAGNOSES:  1. Syncope, resolved.  2. Bradycardia, resolved.  3. Debility, improved.  4. Hypertension.   HOSPITAL COURSE:  PROBLEM 1.  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  She had not complained of chest pain or  shortness of breath, nor any other symptoms.  Inpatient evaluation did  reveal a significant bradycardia with a heart rate in the 40s and 50s.  She  had been on Inderal prior to this admission.  Inderal was adjusted to a  lower dose and upon discharge her heart rate stabilized in the 70s and 80s.  It was also suspected that dehydration may have contributed to her symptoms,  as she has had poor oral intake since the death of her husband several  months ago.   PROBLEM 2.  Debility - [name] [name] has had poor oral intake over the past  several months.  It was also noted that she has episodes of confusion,  especially in the evening.  This was attributed to sundowning.  She was  started on Remeron 15 mg p.o. q.d., which may improve her sundowning  symptoms as well as her appetite.  Physical therapy evaluation was obtained  during this admission.  Evaluation revealed that she is actually doing quite  well and does not need any further physical therapy upon discharge.   PROBLEM 3.  Anemia of chronic disease - [name] [name] will have further anemia  evaluation as an outpatient.  She was placed on Trinsicon during this  admission.   DISPOSITION UPON DISCHARGE:  Ms.  [name]'s oral intake has increased.  She had  no further syncopal episodes and her bradycardia had resolved with a lower  dose of Inderal.   DISCHARGE MEDICATIONS:  1. Protonix 40 mg p.o. q.d.  2. Potassium 10 mEq p.o. q.d.  3. Trinsicon one tablet p.o. b.i.d.  4. Remeron 15 mg p.o. q.d.  5. Inderal 10 mg p.o. b.i.d.  6. HCTZ 12.5 mg p.o. q.d.  7. Ensure pudding three times a day.  8. Tylenol one or two p.o. q.4h. p.r.n. for pain.   FOLLOW UP:  [name] [name] will be seen by Dr. Allyne Gee on Monday, November 26, 2003  at 11 a.m.      Merlene Laughter. Renae Gloss, M.D.  Della Goo, M.D.    KRS/MEDQ  D:  11/12/2003  T:  11/13/2003  Job:  [id number]

## 2010-12-26 NOTE — Op Note (Signed)
[facility]  Patient:    [name] [name], [name] [name]                 MRN: [id number] Proc. Date: 09/28/00 Adm. Date:  [id number] Attending:  Marin Comment CC:         Dorothyann Peng, M.D., [id number] [place], [place] [address]  Hayes Ludwig, N.P.   Operative Report  PREOPERATIVE DIAGNOSIS:  Endometrial biopsy showing papillary atypical endometrium highly suggestive of endometrial cancer.  POSTOPERATIVE DIAGNOSES: 1. Stage IB, grade III adenocarcinoma of the endometrium.  No evidence of    distal spread. 2. Uterine myomas. 3. Left ovarian fibroma.  OPERATION: 1. Exploratory laparotomy. 2. Total abdominal hysterectomy. 3. Bilateral salpingo-oophorectomy.  SURGEON:  Pershing Cox, M.D.  ASSISTANT:  Hayes Ludwig, N.P.  ANESTHESIA:  General endotracheal anesthesia.  INDICATIONS:  This patient is 77 years old.  She presented to [facility] in referral from Dr. Asa Saunas because of postmenopausal bleeding. Endometrial biopsy was performed in the office by Hayes Ludwig.  This showed a typical papillary epithelium consistent with carcinoma.  After preoperative evaluation by her primary physician, Dr. Allyne Gee, she underwent Cardiolite testing by Dr. Lenise Herald.  This showed a normal perfusion study.  With that information, she is brought to the operating room today for a total abdominal hysterectomy, bilateral salpingo-oophorectomy.  Because of the patients age, determination had been made prior to surgery that, regardless of the depth of invasion, that periaortic nodes would not be performed unless there were palpable nodes on her examination.  OPERATIVE FINDINGS:  There was no evidence of ascites, no evidence of peritoneal implants.  There was a small, less than 2 mm, surface implant on the left lobe of the liver.  The left ovary had a 2 cm firm nodule consistent with a fibroma.  There was a pedunculated myoma on the fundus  of the uterus. The right ovary was atrophic.  The uterus itself was approximately 8 weeks in size.  DESCRIPTION OF PROCEDURE:  [name] [name] was brought to the operating room with an IV in place.  She had received a gram of Ancef in the holding area.  PAS stockings were placed on her lower extremities.  She was placed supine on the OR table, and general endotracheal anesthesia was administered.  During this preliminary portion of the operation, every attempt was made to keep her warm. A Bair Hugger was placed immediately upon intubation.  The patient was placed in frog leg position, and the anterior abdominal wall, perineum, and vagina were prepped with a solution of Hibiclens.  A Foley catheter was sterilely inserted into the bladder and left to drain.  The patient was placed again in supine position and draped for a midline incision.  The periumbilical midline incision was marked with a marking pen.  Marcaine 0.25% was injected into the skin edges and subcutaneous tissue, delivering a total volume of 20 cc of 0.25% Marcaine.  Scalpel was used to incise the lower two-thirds of the incision as it lay below the umbilicus.  Subcutaneous tissues were divided with blunt dissection and cautery until the fascia was exposed.  The fascia was exposed by knife.  Fascial incision was extended superiorly and inferiorly with Mayo scissors.  Pyramidalis was used direct [place] to the midline.  The muscle edges were spread.  Peritoneum was identified and tented and opened atraumatically.  There was no evidence of ascites.  We palpated the lateral edge of the peritoneum, and there was no  evidence of implants.  With the peritoneum open, 500 cc of warm saline was instilled in the peritoneal cavity and then collected; 400 cc were collected.  The peritoneal incision was then extended superiorly and inferiorly to the dome of the bladder.  Moist laps were used to protect the lateral walls of the incision, and the  smallest Buchwalter retractor were used to retract the lateral skin edges.  There was no bladder blade which would fit because of the size of the incision; therefore, we used a narrow Deaver throughout the case to give [place] the necessary exposure.  With the patient in a slight amount of Trendelenburg, the gutters were packed with moist laps, and then the bowel was retrieved from the pelvis and packed out of the field.  Long Kelly clamps were placed along the adnexal structures.  Round ligaments were tented and suture ligated, passing through the round ligaments for security.  These round ligaments were cauterized and then cut, opening the broad ligament.  With the broad ligament opened, the peritoneum lateral to the infundibulopelvic ligament was incised.  The IP ligament was gathered by a Babcock clamp.  Attempts were made to see the ureter, but I was not able to visualize it.  I was able to palpate it deep in the pelvis.  With this information, the IP ligaments were then doubly clamped, cut, and suture and free tie ligated.  The distal edge of the IP ligament was then free tied and brought onto the long Kelly clamps which were securing the adnexal structures.  Once this had been completed on each side of the pelvis, we then turned our attention to the bladder flap.  As we were beginning to address the bladder flap, it was clear that there was a large amount of bleeding.  This was coming from the venous structures supporting the left fallopian tube and ovary.  A second tie was placed around this venous structure, and this seemed to control the bleeding.  Approximately 150 to 200 cc were lost during this period of time, however.  The bladder flap was separated from the lower uterine segment by incision.  By lifting the bladder, we were then able to develop a clear space between the lower cervix and vagina and bladder.  With the bladder pushed down, we were then able to skeletonize the  uterine arteries on each side.   The uterine arteries were clamped with Heaney clamps.  They were cut and then suture ligated.  A second tie was placed around the uterine artery by clamping medial to the first pass and then suture ligating such that the suture passed around the initial stitch.  Straight Masterson clamps were placed along the cervix to develop the separation from the cardinal ligament.  Each of these were suture ligated with a Heaney retaining stitch.  When we approached the uterosacral ligaments, a curved Heaney clamp was used to clamp these pedicles. These were suture ligated again with Heaney fixation.  These Heaney stitches allowed [place] to enter the vagina, first on the patients left, then on the patients right.  Using Satinsky scissors, the cervix was separated from the upper vagina, and the specimen was passed off to pathologist.  With Allis clamps on the anterior and posterior vagina, a ______  stitch was placed to secure the anterior vaginal cuff to the cardinal ligament and uterosacral ligament.  This was done by passing in and out of the vagina anteriorly, through the angle, and in and out posteriorly.  This was tied in front of the cardinal ligament stitch.  These stitches were then use to raise the vagina using a running locking stitch.  Once we had secured hemostasis, the anterior and posterior vaginal walls were sutured together.  We then began a careful search for site of bleeders.  The patient was oozing from multiple sites, none of these specific to our operation.  Cautery was used to contain this bleeding in most areas.  On the patients left, there was a small hematoma beneath the cardinal ligament stitch.  This was about 2 cm in size. We watched this carefully during the remainder of the time that we were irrigating and securing hemostasis, and this did not expand.  Once the bleeding had been secured with cautery, the bowel was layered back down at  the pelvis.  All laps were removed.  We then again inspected and irrigated.  The appendix was visualized.  It was very small and in a normal position.  The patients anterior abdominal wall was closed with a mass closure using double stranded 0 PDS.  We started from the superior part of the incision and gathered the fascia through the muscle to the peritoneum with each stitch. Once we had completed the upper two-thirds of the closure, we started from the bottom of the incision with ______ .  For the first two stitches, peritoneum was not included, but bladder peritoneum was included in the third pass.  Once this stitch had been tied one to another, the knot was buried.  Subcutaneous tissues were irrigated.  Careful attention was paid to hemostasis in this area.  Skin staples were applied.  Estimated blood loss 400 cc.  Fluids 3400 cc of crystalloid.  Urine output 275 cc.  Complications none.  Specimen: Peritoneal washings and uterus, fallopian tubes, and ovaries.  Frozen section diagnosis: Grade III papillary endometrial carcinoma extending to less than one-half of the myometrial wall. DD:  09/28/00 TD:  09/29/00 Job: [id number]

## 2010-12-26 NOTE — H&P (Signed)
NAME:  [name] [name], [name] [name]                  ACCOUNT NO.:  [id number]   MEDICAL RECORD NO.:  [id number]          PATIENT TYPE:  EMS   LOCATION:  ED                           FACILITY:  [facility]   PHYSICIAN:  Mobolaji B. Bakare, M.D.DATE OF BIRTH:  [birth year]   DATE OF ADMISSION:  10/08/2004  DATE OF DISCHARGE:                                HISTORY & PHYSICAL   PRIMARY CARE PHYSICIAN:  Robyn N. Allyne Gee, M.D.   CHIEF COMPLAINT:  Pelvic pain.   HISTORY OF PRESENTING COMPLAINT:  Ms. Drum is a pleasant 77 year old  Caucasian female with a history of hypertension, hypercholesterolemia.  She  had an accidental fall on Monday, three days ago, when she tripped over a  carpet.  She developed pain on the right side of her pelvis and was taken to  urgent care.  There she had an x-ray of the pelvis which did not show any  conclusive fracture.  She later saw Dr. Turner Daniels in the office on Tuesday, and  she was scheduled to have an MRI of the pelvis yesterday evening.  Unfortunately, she could not keep the appointment because she started  throwing up after using Vicodin and she could not keep anything down; hence,  she was brought to the emergency department for evaluation.  The patient  experienced hallucination while taking the Vicodin, and she was given  morphine in the emergency department she had a brief episode of confusion.   REVIEW OF SYSTEMS:  She denies any pain at this time.  Pain is aggravated by  movement.  No nausea, no chest pain, no shortness of breath, no cough, no  abdominal pain, no constipation, no diarrhea, no dysuria or urgency.   MEDICATIONS:  Vitamins, calcium carbonate, Prevacid, Vicodin,  hydrochlorothiazide/triamterene 27.5 one p.o. p.r.n. every day for ankle  swelling, atenolol 25 mg p.o. every day.   ALLERGIES:  1.  VICODIN - hallucination.  2.  NIACIN - rash.  3.  CELEBREX - rash.  4.  MORPHINE - confusion.   PAST MEDICAL HISTORY:  1.  Hypertension.  2.   Hypercholesterolemia.  3.  Endometrial cancer, stage 1B grade III adenocarcinoma of the      endometrium.  She is status post total abdominal hysterectomy and      bilateral salpingo-oophorectomy in February 2002.   SOCIAL HISTORY:  She does not smoke cigarettes.  Occasionally drinks  alcohol.  She is independent of activities of daily living, and the patient  is still driving a car.   FAMILY HISTORY:  She is a widow and has a daughter who lives in the area and  is supportive.   PHYSICAL EXAMINATION:  VITAL SIGNS:  Initial temperature 97.6, blood  pressure 139/63, pulse of 80, respiratory rate of 18, O2 sat of 95%.  GENERAL:  She is comfortable, not in respiratory distress.  HEENT:  Normocephalic, atraumatic head.  Pupils are equal, round and  reactive to light.  Extraocular muscle movement intact.  Not pale,  anicteric.  NECK:  No carotid bruit.  No thyromegaly.  LUNGS:  Clear clinically to auscultation.  CV:  S1 S2 regular.  No murmur.  No gallop.  No rub.  ABDOMEN:  Not distended.  Soft, nontender.  Bowel sounds present.  No  palpable organomegaly.  EXTREMITIES:  No pedal edema.  No calf tenderness.  MUSCULOSKELETAL:  Straight leg raising on the right is very minimal.  SKIN:  Seborrheic dermatitis lower extremities.   LABORATORY DATA:  White cell count 16.5, hemoglobin 12.0, hematocrit 38, MCV  87.7, platelets 175, neutrophils 87%, lymphocytes 5%.  Sodium 127, potassium  4.0, chloride 88, bicarb 30, glucose 138, BUN 13, creatinine 0.7, total  protein 6.4, albumin 3.6, AST 92, ALT 38, alkaline phosphatase 68.  Urinalysis was negative for leukocytes and nitrites.  PTT was 31 seconds, PT  12.9, and INR 1.0.   RADIOLOGICAL DATA:  Chest x-ray:  No acute abnormality.  Pelvic x-ray:  Superior inferior rami fracture on the right.   ASSESSMENT/PLAN:  1.  Acute pelvic fracture.  The patient seems to be having advanced reaction      to narcotic analgesic, hence, we use tramadol 50 mg  orally every four      hours as needed.  We will consult Dr. Turner Daniels in the morning.  Physical      therapy occupational therapy evaluations.  2.  Hyponatremia.  This is probably multifactorial secondary to vomiting and      poor oral intake, in addition she uses hydrochlorothiazide/triamterene.      We will hydrate with oral fluid and encourage oral intake and repeat B-      MET in the morning.  3.  Leukocytosis.  This is probably secondary to demargination from stress      of fracture and no obvious source of infection at this time.  4.  Elevated AST.  This is probably from fall.  We will check C-MET in the      morning.  5.  Hypertension currently controlled.  We will continue with atenolol 25 mg      orally daily.  6.  Vomiting.  We will use Phenergan as needed 12.5 mg intravenous q.4h.      MBB/MEDQ  D:  10/09/2004  T:  10/09/2004  Job:  [id number]   cc:   Feliberto Gottron. Turner Daniels, M.D.  [address]  [place]  [place] [address]  Fax: [phone]   Candyce Churn. Allyne Gee, M.D.  [address]  [address]  [place]  [place] [address]  Fax: [phone]

## 2010-12-26 NOTE — Op Note (Signed)
   NAME:  [name], [name] K                            ACCOUNT NO.:  [id number]   MEDICAL RECORD NO.:  [id number]                   PATIENT TYPE:  AMB   LOCATION:  ENDO                                 FACILITY:  [facility]   PHYSICIAN:  John C. Madilyn Fireman, M.D.                 DATE OF BIRTH:  [birth year]   DATE OF PROCEDURE:  05/22/2003  DATE OF DISCHARGE:                                 OPERATIVE REPORT   PROCEDURE:  Colonoscopy.   ENDOSCOPIST:  Everardo All. Madilyn Fireman, M.D.   INDICATION FOR PROCEDURE:  Rectal bleeding and heme-positive stools.   PROCEDURE:  The patient was placed in the left lateral decubitus position  and placed on the pulse monitor with continuous low-flow oxygen delivered by  nasal cannula.  She was sedated with 30 mcg of IV fentanyl and 4 mg IV  Versed.  The Olympus video colonoscope was inserted into the rectum and  advanced to the cecum, confirmed by transillumination of McBurney's point  and visualization of the ileocecal valve and appendiceal orifice.  The prep  was excellent.  The cecum, ascending, transverse and descending colon  appeared normal, no masses, polyps, diverticula or other mucosal  abnormalities.  There were diverticula noted in the sigmoid colon but no  other abnormalities.  The rectum appeared normal but retroflexed view of the  anus revealed some moderately enlarged internal hemorrhoids.  The scope was  then withdrawn and the patient returned to the recovery room in stable  condition.  She tolerated the procedure well and there were no immediate  complications.   IMPRESSION:  1. Diverticulosis.  2. Small internal hemorrhoids.   PLAN:  Treat hemorrhoids symptomatically.                                               John C. Madilyn Fireman, M.D.    JCH/MEDQ  D:  05/22/2003  T:  05/22/2003  Job:  [id number]   cc:   Candyce Churn. Allyne Gee, M.D.  [address]  [address]  [place]  [place] [address]  Fax: [phone]

## 2011-01-25 ENCOUNTER — Emergency Department (HOSPITAL_BASED_OUTPATIENT_CLINIC_OR_DEPARTMENT_OTHER)
Admission: EM | Admit: 2011-01-25 | Discharge: 2011-01-25 | Disposition: A | Discharge status: Home / Self Care | Payer: Medicare Other | Attending: Emergency Medicine | Admitting: Emergency Medicine

## 2011-01-25 ENCOUNTER — Emergency Department (INDEPENDENT_AMBULATORY_CARE_PROVIDER_SITE_OTHER): Payer: Medicare Other

## 2011-01-25 DIAGNOSIS — E7800 Pure hypercholesterolemia, unspecified: Secondary | ICD-10-CM | POA: Insufficient documentation

## 2011-01-25 DIAGNOSIS — I10 Essential (primary) hypertension: Secondary | ICD-10-CM | POA: Insufficient documentation

## 2011-01-25 DIAGNOSIS — F0390 Unspecified dementia without behavioral disturbance: Secondary | ICD-10-CM | POA: Insufficient documentation

## 2011-01-25 DIAGNOSIS — J90 Pleural effusion, not elsewhere classified: Secondary | ICD-10-CM

## 2011-01-25 DIAGNOSIS — Z0389 Encounter for observation for other suspected diseases and conditions ruled out: Secondary | ICD-10-CM | POA: Insufficient documentation

## 2011-01-25 DIAGNOSIS — J4489 Other specified chronic obstructive pulmonary disease: Secondary | ICD-10-CM | POA: Insufficient documentation

## 2011-01-25 DIAGNOSIS — R5381 Other malaise: Secondary | ICD-10-CM

## 2011-01-25 DIAGNOSIS — J449 Chronic obstructive pulmonary disease, unspecified: Secondary | ICD-10-CM | POA: Insufficient documentation

## 2011-01-25 LAB — URINALYSIS, ROUTINE W REFLEX MICROSCOPIC
Hgb urine dipstick: NEGATIVE
Leukocytes, UA: NEGATIVE
Nitrite: NEGATIVE
Protein, ur: NEGATIVE mg/dL
Urobilinogen, UA: 0.2 mg/dL (ref 0.0–1.0)

## 2011-01-25 LAB — COMPREHENSIVE METABOLIC PANEL
BUN: 21 mg/dL (ref 6–23)
CO2: 27 mEq/L (ref 19–32)
Calcium: 10 mg/dL (ref 8.4–10.5)
Chloride: 97 mEq/L (ref 96–112)
Creatinine, Ser: 1.1 mg/dL (ref 0.50–1.10)
GFR calc Af Amer: 56 mL/min — ABNORMAL LOW (ref >60)
GFR calc non Af Amer: 46 mL/min — ABNORMAL LOW (ref >60)
Glucose, Bld: 113 mg/dL — ABNORMAL HIGH (ref 70–99)
Total Bilirubin: 0.3 mg/dL (ref 0.3–1.2)

## 2011-01-25 LAB — CBC
HCT: 39.1 % (ref 36.0–46.0)
MCH: 26.1 pg (ref 26.0–34.0)
MCV: 76.7 fL — ABNORMAL LOW (ref 78.0–100.0)
RBC: 5.1 MIL/uL (ref 3.87–5.11)
WBC: 13.3 10*3/uL — ABNORMAL HIGH (ref 4.0–10.5)

## 2011-01-25 LAB — DIFFERENTIAL
Lymphocytes Relative: 7 % — ABNORMAL LOW (ref 12–46)
Lymphs Abs: 0.9 10*3/uL (ref 0.7–4.0)
Monocytes Relative: 11 % (ref 3–12)
Neutrophils Relative %: 82 % — ABNORMAL HIGH (ref 43–77)

## 2011-05-07 LAB — COMPREHENSIVE METABOLIC PANEL WITH GFR
ALT: 27
AST: 34
Albumin: 3.5
Alkaline Phosphatase: 109
BUN: 9
CO2: 30
Calcium: 9.3
Chloride: 92 — ABNORMAL LOW
Creatinine, Ser: 0.76
GFR calc non Af Amer: >60
Glucose, Bld: 119 — ABNORMAL HIGH
Potassium: 3.6
Sodium: 133 — ABNORMAL LOW
Total Bilirubin: 1.6 — ABNORMAL HIGH
Total Protein: 6.1

## 2011-05-07 LAB — CBC
HCT: 42.3
HCT: 42.7
HCT: 44.4
Hemoglobin: 14.2
Hemoglobin: 14.4
Hemoglobin: 15.1 — ABNORMAL HIGH
MCHC: 33.6
MCHC: 33.6
MCHC: 33.7
MCHC: 33.9
MCHC: 34
MCV: 84.1
MCV: 84.2
MCV: 84.4
MCV: 84.5
MCV: 85.6
MCV: 84
MCV: 84.8
Platelets: 233
Platelets: 243
Platelets: 278
Platelets: 223
RBC: 5.02
RBC: 5.09
RBC: 5.14 — ABNORMAL HIGH
RBC: 5.17 — ABNORMAL HIGH
RBC: 5.26 — ABNORMAL HIGH
RBC: 5.53 — ABNORMAL HIGH
RDW: 15.7 — ABNORMAL HIGH
RDW: 15.8 — ABNORMAL HIGH
RDW: 15.9 — ABNORMAL HIGH
WBC: 13.6 — ABNORMAL HIGH
WBC: 14.2 — ABNORMAL HIGH
WBC: 15 — ABNORMAL HIGH
WBC: 17.3 — ABNORMAL HIGH
WBC: 14.4 — ABNORMAL HIGH
WBC: 12.8 — ABNORMAL HIGH

## 2011-05-07 LAB — BASIC METABOLIC PANEL
BUN: 20
BUN: 21
BUN: 14
CO2: 27
CO2: 30
CO2: 31
Calcium: 9.1
Calcium: 9.6
Chloride: 89 — ABNORMAL LOW
Chloride: 92 — ABNORMAL LOW
Chloride: 93 — ABNORMAL LOW
Chloride: 96
Chloride: 101
Chloride: 104
Creatinine, Ser: 0.83
Creatinine, Ser: 0.86
Creatinine, Ser: 0.86
Creatinine, Ser: 0.99
Creatinine, Ser: 0.75
Creatinine, Ser: 1.08
GFR calc Af Amer: >60
GFR calc Af Amer: >60
GFR calc Af Amer: >60
GFR calc Af Amer: >60
GFR calc Af Amer: 58 — ABNORMAL LOW
GFR calc Af Amer: >60
GFR calc non Af Amer: >60
GFR calc non Af Amer: >60
Glucose, Bld: 103 — ABNORMAL HIGH
Glucose, Bld: 113 — ABNORMAL HIGH
Potassium: 3.1 — ABNORMAL LOW
Potassium: 3.8
Potassium: 4
Potassium: 4
Sodium: 133 — ABNORMAL LOW
Sodium: 128 — ABNORMAL LOW
Sodium: 145

## 2011-05-07 LAB — URINALYSIS, ROUTINE W REFLEX MICROSCOPIC
Nitrite: NEGATIVE
Protein, ur: NEGATIVE
Protein, ur: NEGATIVE
Specific Gravity, Urine: 1.014
Urobilinogen, UA: 0.2
Urobilinogen, UA: 0.2

## 2011-05-07 LAB — DIFFERENTIAL
Basophils Relative: 0
Eosinophils Absolute: 0.1
Neutro Abs: 12.2 — ABNORMAL HIGH
Neutrophils Relative %: 85 — ABNORMAL HIGH

## 2011-05-07 LAB — URINE CULTURE: Special Requests: NEGATIVE

## 2011-05-07 LAB — COMPREHENSIVE METABOLIC PANEL
CO2: 32
Calcium: 8.8
Creatinine, Ser: 0.73
GFR calc non Af Amer: >60
Glucose, Bld: 95

## 2011-05-07 LAB — CULTURE, BLOOD (ROUTINE X 2)
Culture: NO GROWTH
Culture: NO GROWTH

## 2011-05-07 LAB — URINE MICROSCOPIC-ADD ON

## 2011-05-07 LAB — B-NATRIURETIC PEPTIDE (CONVERTED LAB): Pro B Natriuretic peptide (BNP): 261 — ABNORMAL HIGH

## 2011-05-07 LAB — HEPATIC FUNCTION PANEL
Indirect Bilirubin: 1.2 — ABNORMAL HIGH
Total Protein: 7.4

## 2011-05-07 LAB — LIPID PANEL
Cholesterol: 154
LDL Cholesterol: 76

## 2011-05-07 LAB — SODIUM, URINE, RANDOM: Sodium, Ur: 22

## 2011-05-07 LAB — LIPASE, BLOOD: Lipase: 52

## 2011-05-07 LAB — PROTIME-INR
INR: 1
Prothrombin Time: 13.4

## 2011-05-07 LAB — D-DIMER, QUANTITATIVE

## 2011-05-15 LAB — CARDIAC PANEL(CRET KIN+CKTOT+MB+TROPI)
CK, MB: 10.4 — ABNORMAL HIGH
CK, MB: 13.5 — ABNORMAL HIGH
Relative Index: 3.2 — ABNORMAL HIGH
Total CK: 403 — ABNORMAL HIGH
Troponin I: 0.04

## 2011-05-15 LAB — COMPREHENSIVE METABOLIC PANEL
ALT: 21
ALT: 22
AST: 40 — ABNORMAL HIGH
AST: 40 — ABNORMAL HIGH
Albumin: 3.3 — ABNORMAL LOW
Albumin: 3.9
Alkaline Phosphatase: 67
BUN: 14
CO2: 24
CO2: 28
Calcium: 9.1
Calcium: 9.8
Chloride: 95 — ABNORMAL LOW
Creatinine, Ser: 0.98
GFR calc Af Amer: >60
GFR calc Af Amer: >60
GFR calc non Af Amer: 53 — ABNORMAL LOW
GFR calc non Af Amer: >60
Glucose, Bld: 112 — ABNORMAL HIGH
Potassium: 4.3
Sodium: 131 — ABNORMAL LOW
Sodium: 134 — ABNORMAL LOW
Total Bilirubin: 1.1
Total Protein: 6.8

## 2011-05-15 LAB — CK TOTAL AND CKMB (NOT AT ARMC)
CK, MB: 10.5 — ABNORMAL HIGH
CK, MB: 11.5 — ABNORMAL HIGH
Relative Index: 3.1 — ABNORMAL HIGH
Relative Index: 3.2 — ABNORMAL HIGH
Total CK: 284 — ABNORMAL HIGH
Total CK: 326 — ABNORMAL HIGH
Total CK: 369 — ABNORMAL HIGH

## 2011-05-15 LAB — CBC
HCT: 38.7
HCT: 39.1
Hemoglobin: 13.2
Hemoglobin: 13.4
MCHC: 34
MCHC: 34.2
MCV: 86.2
MCV: 86.5
Platelets: 224
Platelets: 248
RBC: 4.4
RBC: 4.47
RBC: 4.53
RDW: 14.4
WBC: 10
WBC: 8
WBC: 8.2

## 2011-05-15 LAB — URINALYSIS, ROUTINE W REFLEX MICROSCOPIC
Bilirubin Urine: NEGATIVE
Glucose, UA: NEGATIVE
Hgb urine dipstick: NEGATIVE
Nitrite: NEGATIVE
Protein, ur: NEGATIVE
Specific Gravity, Urine: 1.007
Urobilinogen, UA: 0.2
pH: 7.5

## 2011-05-15 LAB — URINE CULTURE
Colony Count: NO GROWTH
Culture: NO GROWTH

## 2011-05-15 LAB — BASIC METABOLIC PANEL
BUN: 10
Chloride: 100
GFR calc Af Amer: >60
GFR calc non Af Amer: >60
Potassium: 4.7
Sodium: 132 — ABNORMAL LOW

## 2011-05-15 LAB — DIFFERENTIAL
Basophils Absolute: 0
Basophils Relative: 0
Eosinophils Absolute: 0
Eosinophils Absolute: 0.1
Eosinophils Relative: 0
Eosinophils Relative: 2
Lymphocytes Relative: 12
Lymphocytes Relative: 19
Lymphs Abs: 1.2
Lymphs Abs: 1.6
Monocytes Absolute: 0.5
Monocytes Relative: 5
Monocytes Relative: 8
Neutro Abs: 8.3 — ABNORMAL HIGH
Neutrophils Relative %: 83 — ABNORMAL HIGH

## 2011-05-15 LAB — TROPONIN I
Troponin I: 0.04
Troponin I: 0.04

## 2011-05-15 LAB — LIPID PANEL
Cholesterol: 167
HDL: 75
LDL Cholesterol: 83
Total CHOL/HDL Ratio: 2.2
Triglycerides: 43
VLDL: 9

## 2011-05-15 LAB — TSH: TSH: 2.093

## 2011-05-15 LAB — B-NATRIURETIC PEPTIDE (CONVERTED LAB): Pro B Natriuretic peptide (BNP): 244 — ABNORMAL HIGH

## 2011-05-15 LAB — LIPASE, BLOOD: Lipase: 22

## 2012-04-01 ENCOUNTER — Encounter (HOSPITAL_COMMUNITY): Payer: Self-pay | Admitting: *Deleted

## 2012-04-01 ENCOUNTER — Inpatient Hospital Stay (HOSPITAL_COMMUNITY)
Admission: EM | Admit: 2012-04-01 | Discharge: 2012-04-03 | DRG: 812 | Disposition: A | Discharge status: Skilled Nursing Facility | Payer: Medicare Other | Attending: Internal Medicine | Admitting: Internal Medicine

## 2012-04-01 ENCOUNTER — Emergency Department (HOSPITAL_COMMUNITY): Payer: Medicare Other

## 2012-04-01 DIAGNOSIS — K8689 Other specified diseases of pancreas: Secondary | ICD-10-CM | POA: Diagnosis present

## 2012-04-01 DIAGNOSIS — K869 Disease of pancreas, unspecified: Secondary | ICD-10-CM | POA: Diagnosis present

## 2012-04-01 DIAGNOSIS — Z66 Do not resuscitate: Secondary | ICD-10-CM | POA: Diagnosis present

## 2012-04-01 DIAGNOSIS — R0600 Dyspnea, unspecified: Secondary | ICD-10-CM

## 2012-04-01 DIAGNOSIS — I10 Essential (primary) hypertension: Secondary | ICD-10-CM | POA: Diagnosis present

## 2012-04-01 DIAGNOSIS — Z79899 Other long term (current) drug therapy: Secondary | ICD-10-CM

## 2012-04-01 DIAGNOSIS — D649 Anemia, unspecified: Secondary | ICD-10-CM

## 2012-04-01 DIAGNOSIS — F068 Other specified mental disorders due to known physiological condition: Secondary | ICD-10-CM | POA: Diagnosis present

## 2012-04-01 DIAGNOSIS — R0989 Other specified symptoms and signs involving the circulatory and respiratory systems: Secondary | ICD-10-CM | POA: Diagnosis present

## 2012-04-01 DIAGNOSIS — E785 Hyperlipidemia, unspecified: Secondary | ICD-10-CM | POA: Diagnosis present

## 2012-04-01 DIAGNOSIS — R0609 Other forms of dyspnea: Secondary | ICD-10-CM | POA: Diagnosis present

## 2012-04-01 DIAGNOSIS — F0390 Unspecified dementia without behavioral disturbance: Secondary | ICD-10-CM | POA: Diagnosis present

## 2012-04-01 DIAGNOSIS — R531 Weakness: Secondary | ICD-10-CM

## 2012-04-01 DIAGNOSIS — Z8542 Personal history of malignant neoplasm of other parts of uterus: Secondary | ICD-10-CM

## 2012-04-01 DIAGNOSIS — D509 Iron deficiency anemia, unspecified: Principal | ICD-10-CM | POA: Diagnosis present

## 2012-04-01 HISTORY — DX: Hyperlipidemia, unspecified: E78.5

## 2012-04-01 HISTORY — DX: Other specified diseases of pancreas: K86.89

## 2012-04-01 HISTORY — DX: Unspecified malignant neoplasm of skin, unspecified: C44.90

## 2012-04-01 HISTORY — DX: Other specified disorders of bone density and structure, unspecified site: M85.80

## 2012-04-01 HISTORY — DX: Unspecified dementia, unspecified severity, without behavioral disturbance, psychotic disturbance, mood disturbance, and anxiety: F03.90

## 2012-04-01 HISTORY — DX: Malignant (primary) neoplasm, unspecified: C80.1

## 2012-04-01 HISTORY — DX: Essential (primary) hypertension: I10

## 2012-04-01 HISTORY — DX: Wedge compression fracture of unspecified thoracic vertebra, initial encounter for closed fracture: S22.000A

## 2012-04-01 LAB — PREPARE RBC (CROSSMATCH)

## 2012-04-01 LAB — CBC WITH DIFFERENTIAL/PLATELET
Basophils Absolute: 0.1 10*3/uL (ref 0.0–0.1)
Basophils Relative: 1 % (ref 0–1)
Eosinophils Absolute: 0.1 10*3/uL (ref 0.0–0.7)
Eosinophils Relative: 1 % (ref 0–5)
Hemoglobin: 6.5 g/dL — CL (ref 12.0–15.0)
Lymphs Abs: 2.1 10*3/uL (ref 0.7–4.0)
MCH: 20.1 pg — ABNORMAL LOW (ref 26.0–34.0)
MCV: 65.4 fL — ABNORMAL LOW (ref 78.0–100.0)
Monocytes Absolute: 0.8 10*3/uL (ref 0.1–1.0)
Monocytes Relative: 10 % (ref 3–12)
Neutrophils Relative %: 61 % (ref 43–77)
Platelets: 378 10*3/uL (ref 150–400)
RBC: 3.24 MIL/uL — ABNORMAL LOW (ref 3.87–5.11)
WBC: 7.6 10*3/uL (ref 4.0–10.5)

## 2012-04-01 LAB — BASIC METABOLIC PANEL
CO2: 27 mEq/L (ref 19–32)
Calcium: 9.4 mg/dL (ref 8.4–10.5)
Creatinine, Ser: 0.8 mg/dL (ref 0.50–1.10)
Glucose, Bld: 111 mg/dL — ABNORMAL HIGH (ref 70–99)

## 2012-04-01 LAB — URINALYSIS, ROUTINE W REFLEX MICROSCOPIC
Bilirubin Urine: NEGATIVE
Glucose, UA: NEGATIVE mg/dL
Hgb urine dipstick: NEGATIVE
Ketones, ur: NEGATIVE mg/dL
Protein, ur: NEGATIVE mg/dL

## 2012-04-01 MED ORDER — ACETAMINOPHEN 650 MG RE SUPP: 650.0000 mg | Freq: Four times a day (QID) | RECTAL | Status: DC | PRN

## 2012-04-01 MED ORDER — ACETAMINOPHEN 325 MG PO TABS: 650.0000 mg | ORAL_TABLET | Freq: Four times a day (QID) | ORAL | Status: DC | PRN

## 2012-04-01 MED ORDER — VITAMIN D3 25 MCG (1000 UNIT) PO TABS
1000.0000 [IU] | ORAL_TABLET | Freq: Every day | ORAL | Status: DC
  Administered 2012-04-02 – 2012-04-03 (×2): 1000 [IU] via ORAL
  Filled 2012-04-01 (×2): qty 1

## 2012-04-01 MED ORDER — SODIUM CHLORIDE 0.9 % IJ SOLN
3.0000 mL | Freq: Two times a day (BID) | INTRAMUSCULAR | Status: DC
  Administered 2012-04-01 – 2012-04-02 (×2): 3 mL via INTRAVENOUS

## 2012-04-01 MED ORDER — ONDANSETRON HCL 4 MG/2ML IJ SOLN: 4.0000 mg | Freq: Four times a day (QID) | INTRAMUSCULAR | Status: DC | PRN

## 2012-04-01 MED ORDER — MEMANTINE HCL 10 MG PO TABS
10.0000 mg | ORAL_TABLET | Freq: Two times a day (BID) | ORAL | Status: DC
  Administered 2012-04-01 – 2012-04-03 (×4): 10 mg via ORAL
  Filled 2012-04-01 (×5): qty 1

## 2012-04-01 MED ORDER — ONDANSETRON HCL 4 MG PO TABS: 4.0000 mg | ORAL_TABLET | Freq: Four times a day (QID) | ORAL | Status: DC | PRN

## 2012-04-01 MED ORDER — METOPROLOL TARTRATE 25 MG PO TABS
25.0000 mg | ORAL_TABLET | Freq: Two times a day (BID) | ORAL | Status: DC
  Administered 2012-04-01 – 2012-04-03 (×4): 25 mg via ORAL
  Filled 2012-04-01 (×5): qty 1

## 2012-04-01 MED ORDER — POLYETHYLENE GLYCOL 3350 17 G PO PACK
17.0000 g | PACK | ORAL | Status: DC
  Administered 2012-04-02: 17 g via ORAL
  Filled 2012-04-01 (×2): qty 1

## 2012-04-01 MED ORDER — SODIUM CHLORIDE 0.9 % IV SOLN
INTRAVENOUS | Status: DC
  Administered 2012-04-01: via INTRAVENOUS

## 2012-04-01 MED ORDER — ATORVASTATIN CALCIUM 10 MG PO TABS
5.0000 mg | ORAL_TABLET | Freq: Every day | ORAL | Status: DC
  Administered 2012-04-02: 5 mg via ORAL
  Filled 2012-04-01 (×2): qty 0.5

## 2012-04-01 MED ORDER — SENNOSIDES-DOCUSATE SODIUM 8.6-50 MG PO TABS
2.0000 | ORAL_TABLET | Freq: Every day | ORAL | Status: DC
  Administered 2012-04-01 – 2012-04-02 (×2): 2 via ORAL
  Filled 2012-04-01 (×3): qty 2

## 2012-04-01 NOTE — ED Notes (Signed)
Dr. Effie Shy at bedside for hemoccult.

## 2012-04-01 NOTE — ED Notes (Signed)
Pt BIB EMS. Pt from [facility]. Pt c/o SOB with walking. Pt c/o R leg pain. No injury noted per EMS. Pt has hx of dementia. Pt at normal baseline per EMS.

## 2012-04-01 NOTE — ED Notes (Signed)
MD at bedside. Dr. Effie Shy at bedside speaking with pt's family.

## 2012-04-01 NOTE — ED Notes (Signed)
Second attempt to call report. RN unavailable. Will call back.

## 2012-04-01 NOTE — H&P (Signed)
[facility] History and Physical  [name] [name] ZOX:[id number] DOB: 01-Apr-1915 DOA: 04/01/2012  Referring physician: Dr. Effie Shy PCP: Kimber Relic, MD   Chief Complaint:low hemoglobin per SNF  HPI:  The patient is a 78 year old white female resident of [facility] with past medical history significant for dementia, pancreatic mass in the past-2009, who presents with above complaints. The history is obtained from her son and daughter at the bedside as well as SNF records due to patient's dementia. They report that patient appeared to be getting 'winded' with exertion at the nursing facility, weakness and appeared to be pale and so blood work was a done and showed a hemoglobin of 7.1 and she was brought to the ED for further evaluation and management. The patient denies any symptoms and does not understand why she was brought to the ED. Per family she's not had any nausea vomiting, abd pain, hematemesis, melena and no hematochezia, also no reports of dizziness or chest pain.in the ED a chest x-ray was done which showed interval development of mild-to-moderate bilateral pleural effusions,hemoglobin was done and came back at 6.5,stool guaiac and ED was negative, and 2 units of packed red blood cells ordered per EDP and admission for further evaluation and management requested. As noted above patient's CT in 2009 revealed a pancreatic mass and per discharge summary she was to follow up with Dr. Elnoria Howard outpatient, daughter states she did followup with Dr. Elnoria Howard but that she never heard anything else concerning the mass,but she also states that they had discussed that they did not want any invasive measures/interventions.she states that they would like to find out the cause of her anemia.she confirms that patient is a DO NOT RESUSCITATE. Review of Systems:  Per family no report of anorexia, fever, weight loss,, vision loss, decreased hearing, hoarseness, chest pain, syncope,  peripheral  edema, balance deficits, hemoptysis, abdominal pain, melena, hematochezia, severe indigestion/heartburn, hematuria, , transient blindness, difficulty walking, depression, unusual weight change, abnormal bleeding.   Past Medical History  Diagnosis Date  . Hypertension   . Hyperlipidemia   . Cancer     uterine  . Skin cancer     basal, squamous cell - radiation  . Compression fx, thoracic spine 01/22/2008    fx T9 - on bedrest x 6 months  . Osteopenia   . Dementia   . Pancreatic mass approx. 2009    Past Surgical History  Procedure Date  . Abdominal hysterectomy     for ca   Social History:  reports that she has never smoked. She has never used smokeless tobacco. She reports that she does not drink alcohol or use illicit drugs.  where does patient live-SNF Allergies  Allergen Reactions  . Actonel (Risedronate Sodium)     Noted on MAR.  Marland Kitchen Fosamax (Alendronate Sodium)     Noted on MAR.  Marland Kitchen Lortab (Hydrocodone-Acetaminophen)     Noted on MAR.  . Masoprocol     Noted on MAR.  Marland Kitchen Morphine And Related     Noted on MAR.  Marland Kitchen Niacin And Related     Noted on MAR.  Marland Kitchen Phenergan (Promethazine Hcl)     Noted on MAR.  . Quinidine     Noted on MAR.  . Quinine Derivatives     Noted on MAR.    History reviewed. No pertinent family history.   Prior to Admission medications   Medication Sig Start Date End Date Taking? Authorizing Provider  atorvastatin (LIPITOR)  10 MG tablet Take 5 mg by mouth daily.   Yes Historical Provider, MD  Calcium Carbonate-Vitamin D (CALCIUM 600 + D PO) Take 1 tablet by mouth daily.   Yes Historical Provider, MD  cholecalciferol (VITAMIN D) 1000 UNITS tablet Take 1,000 Units by mouth daily.   Yes Historical Provider, MD  memantine (NAMENDA) 10 MG tablet Take 10 mg by mouth 2 (two) times daily.   Yes Historical Provider, MD  metoprolol tartrate (LOPRESSOR) 25 MG tablet Take 25 mg by mouth 2 (two) times daily. Hold for BP < 90/60 or HR < 50.   Yes Historical  Provider, MD  polyethylene glycol (MIRALAX / GLYCOLAX) packet Take 17 g by mouth every other day.   Yes Historical Provider, MD  senna-docusate (SENOKOT-S) 8.6-50 MG per tablet Take 2 tablets by mouth at bedtime.   Yes Historical Provider, MD  vitamin A 16109 UNIT capsule Take 20,000 Units by mouth daily.   Yes Historical Provider, MD   Physical Exam: Filed Vitals:   04/01/12 1513 04/01/12 1518 04/01/12 1830 04/01/12 1943  BP:  148/60 150/62 151/57  Pulse:  91  95  Temp:  97.9 F (36.6 C)  98.6 F (37 C)  TempSrc:  Oral  Oral  Resp:  20 21 18   SpO2: 99% 97%  100%   Constitutional: Vital signs reviewed.  Patient is a well-developed and well-nourished in no acute distress and cooperative with exam. Alert and disoriented.  Head: Normocephalic and atraumatic Mouth: no erythema or exudates, MMM Eyes: PERRL, EOMI, conjunctival pallor, No scleral icterus.  Neck: Supple, Trachea midline normal ROM, No JVD, mass, thyromegaly, or carotid bruit present.  Cardiovascular: RRR, S1 normal, S2 normal, no MRG, pulses symmetric and intact bilaterally Pulmonary/Chest: clear anteriorly, decreased breath sounds at the bases,no crackles or wheezes. Abdominal: Soft. Non-tender, non-distended, bowel sounds are normal, no masses, organomegaly, or guarding present.  Extremities: No cyanosis and no edema Neurological: A&O x3, Strength is normal and symmetric bilaterally, cranial nerve II-XII are grossly intact, no focal motor deficit, sensory intact to light touch bilaterally.  Skin: Warm, dry and intact. No rash, cyanosis, or clubbing.   Labs on Admission:  Basic Metabolic Panel:  Lab 04/01/12 6045  NA 135  K 3.8  CL 100  CO2 27  GLUCOSE 111*  BUN 21  CREATININE 0.80  CALCIUM 9.4  MG --  PHOS --   Liver Function Tests: No results found for this basename: AST:5,ALT:5,ALKPHOS:5,BILITOT:5,PROT:5,ALBUMIN:5 in the last 168 hours No results found for this basename: LIPASE:5,AMYLASE:5 in the last 168  hours No results found for this basename: AMMONIA:5 in the last 168 hours CBC:  Lab 04/01/12 1757  WBC 7.6  NEUTROABS 4.5  HGB 6.5*  HCT 21.2*  MCV 65.4*  PLT 378   Cardiac Enzymes: No results found for this basename: CKTOTAL:5,CKMB:5,CKMBINDEX:5,TROPONINI:5 in the last 168 hours  BNP (last 3 results) No results found for this basename: PROBNP:3 in the last 8760 hours CBG: No results found for this basename: GLUCAP:5 in the last 168 hours  Radiological Exams on Admission: Dg Chest 2 View  04/01/2012  *RADIOLOGY REPORT*  Clinical Data: Shortness of breath  CHEST - 2 VIEW  Comparison: January 25, 2011.  Findings: There is interval development of mild to moderate bilateral pleural effusions.  Underlying pneumonia or atelectasis cannot be excluded.  Bony thorax appears intact.  IMPRESSION: Interval development of mild to moderate bilateral pleural effusions.   Original Report Authenticated By: Venita Sheffield., M.D.  Assessment/Plan Principal Problem:  *Microcytic anemia -as discussed above, her last hemoglobin about a year ago was 13.3- per Epic records,and today at  nursing home 7.1 -will obtain an anemia panel prior to blood transfusion, also obtain a haptoglobin level. -no evidence of active GI bleeding, and guaiac in ED negative x1, obtain further stool guaiacs, monitor H&H's follow and further manage accordingly pending evolution of clinical course. Active Problems: Dyspnea on exertion -#1 contributing factor,transfuse/management as above -chest x-ray noted with mild to moderate pleural effusions-will obtain CT scan of chest to further evaluate and BNP in am -follow  Hypertension -she is hemodynamically stable, monitor and continue outpatient medications with hold parameters Dementia -continue outpatient medications.  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 Elnoria Howard. -she denies any abdominal or GI symptoms at this time, and as above per daughter and they did not want any invasive measures.   Code Status:DNR/DNI Family Communication: daughter and son at the bedside Disposition Plan:admit to telemetry  Time spent: >62mins  Kela Millin [facility] Pager [phone]  If 7PM-7AM, please contact night-coverage [email or web address] Password [facility] 04/01/2012, 9:28 PM

## 2012-04-01 NOTE — ED Provider Notes (Signed)
History     CSN: [id number]  Arrival date & time 04/01/12  1510   First MD Initiated Contact with Patient 04/01/12 1616      Chief Complaint  Patient presents with  . Shortness of Air     (Consider location/radiation/quality/duration/timing/severity/associated sxs/prior treatment) HPI Comments: [name] [name] is a 78 y.o. Female who reportedly was short of breath. When walking to her dining room today. She also has been weaker than usual, and pale in appearance; according to reports from her facility. Patient is unable to contribute to history. She has dementia at baseline. She is here with her daughter, who has not seen her in about a week. The daughter feels that she is essentially at her baseline. The daughter reports that the patient cannot give history  at baseline.  Level V Caveat- dementia  The history is provided by the patient.    Past Medical History  Diagnosis Date  . Hypertension   . Hyperlipidemia   . Cancer     uterine  . Skin cancer     basal, squamous cell - radiation  . Compression fx, thoracic spine 01/22/2008    fx T9 - on bedrest x 6 months  . Osteopenia   . Dementia   . Pancreatic mass approx. 2009     Past Surgical History  Procedure Date  . Abdominal hysterectomy     for ca    History reviewed. No pertinent family history.  History  Substance Use Topics  . Smoking status: Never Smoker   . Smokeless tobacco: Never Used  . Alcohol Use: No    OB History    Grav Para Term Preterm Abortions TAB SAB Ect Mult Living                  Review of Systems  Unable to perform ROS   Allergies  Actonel; Fosamax; Lortab; Masoprocol; Morphine and related; Niacin and related; Phenergan; Quinidine; and Quinine derivatives  Home Medications   No current outpatient prescriptions on file.  BP 159/72  Pulse 97  Temp 98.2 F (36.8 C) (Oral)  Resp 18  Ht 5\' 5"  (1.651 m)  Wt 126 lb 5.2 oz (57.3 kg)  BMI 21.02 kg/m2  SpO2 93%  Physical Exam    Nursing note and vitals reviewed. Constitutional: She appears well-developed and well-nourished.  HENT:  Head: Normocephalic and atraumatic.  Eyes: Conjunctivae and EOM are normal. Pupils are equal, round, and reactive to light.  Neck: Normal range of motion and phonation normal. Neck supple.  Cardiovascular: Normal rate, regular rhythm and intact distal pulses.   Pulmonary/Chest: Effort normal and breath sounds normal. She exhibits no tenderness.  Abdominal: Soft. She exhibits no distension. There is no tenderness. There is no guarding.  Musculoskeletal: Normal range of motion.  Neurological: She is alert. She has normal strength. She exhibits normal muscle tone.  Skin: Skin is warm and dry.       Numerous chronic-appearing skin lesions, c/w age related changes.  Psychiatric: Her behavior is normal.    ED Course  Procedures (including critical care time)  Labs Reviewed  CBC WITH DIFFERENTIAL - Abnormal; Notable for the following:    RBC 3.24 (*)     Hemoglobin 6.5 (*)     HCT 21.2 (*)     MCV 65.4 (*)     MCH 20.1 (*)     All other components within normal limits  BASIC METABOLIC PANEL - Abnormal; Notable for the following:  Glucose, Bld 111 (*)     GFR calc non Af Amer 61 (*)     GFR calc Af Amer 70 (*)     All other components within normal limits  URINALYSIS, ROUTINE W REFLEX MICROSCOPIC - Abnormal; Notable for the following:    Leukocytes, UA MODERATE (*)     All other components within normal limits  URINE MICROSCOPIC-ADD ON - Abnormal; Notable for the following:    Bacteria, UA FEW (*)     All other components within normal limits  RETICULOCYTES - Abnormal; Notable for the following:    RBC. 2.97 (*)     All other components within normal limits  OCCULT BLOOD, POC DEVICE  PREPARE RBC (CROSSMATCH)  TYPE AND SCREEN  ABO/RH  OCCULT BLOOD X 1 CARD TO LAB, STOOL  OCCULT BLOOD X 1 CARD TO LAB, STOOL  CBC  BASIC METABOLIC PANEL  VITAMIN B12  FOLATE  IRON AND  TIBC  FERRITIN  HAPTOGLOBIN  PRO B NATRIURETIC PEPTIDE  MRSA PCR SCREENING   Dg Chest 2 View  04/01/2012  *RADIOLOGY REPORT*  Clinical Data: Shortness of breath  CHEST - 2 VIEW  Comparison: January 25, 2011.  Findings: There is interval development of mild to moderate bilateral pleural effusions.  Underlying pneumonia or atelectasis cannot be excluded.  Bony thorax appears intact.  IMPRESSION: Interval development of mild to moderate bilateral pleural effusions.   Original Report Authenticated By: Venita Sheffield., M.D.      1. Anemia   2. Weakness   3. Dyspnea   4. Hypertension   5. Pancreatic mass   6. DOE (dyspnea on exertion)       MDM  Weakness, with dyspnea; likely caused by microcytic anemia. No obvious source for bleeding under evaluation. Patient has symptomatic anemia. Will need to be transfused. Admitted for transfusion and monitoring           Flint Melter, MD 04/02/12 [phone]

## 2012-04-01 NOTE — Progress Notes (Signed)
Confirmed pcp is arthur green from snf

## 2012-04-01 NOTE — ED Notes (Signed)
Attempted to call report. RN unable to take report at this time. Will call back.  

## 2012-04-01 NOTE — ED Notes (Signed)
Pt states she does not know why she is here. States she feels fine. Pt denies difficulty breathing and R leg pain.

## 2012-04-01 NOTE — ED Notes (Signed)
Bed:WA13<BR> Expected date:<BR> Expected time:<BR> Means of arrival:<BR> Comments:<BR> ems

## 2012-04-02 ENCOUNTER — Inpatient Hospital Stay (HOSPITAL_COMMUNITY): Payer: Medicare Other

## 2012-04-02 ENCOUNTER — Encounter (HOSPITAL_COMMUNITY): Payer: Self-pay | Admitting: *Deleted

## 2012-04-02 DIAGNOSIS — F0390 Unspecified dementia without behavioral disturbance: Secondary | ICD-10-CM

## 2012-04-02 LAB — BASIC METABOLIC PANEL
BUN: 15 mg/dL (ref 6–23)
CO2: 27 mEq/L (ref 19–32)
Calcium: 9.1 mg/dL (ref 8.4–10.5)
Chloride: 101 mEq/L (ref 96–112)
Creatinine, Ser: 0.77 mg/dL (ref 0.50–1.10)
GFR calc Af Amer: 80 mL/min — ABNORMAL LOW (ref >90)

## 2012-04-02 LAB — CBC
HCT: 31.2 % — ABNORMAL LOW (ref 36.0–46.0)
MCH: 22.5 pg — ABNORMAL LOW (ref 26.0–34.0)
MCV: 69.6 fL — ABNORMAL LOW (ref 78.0–100.0)
Platelets: 304 10*3/uL (ref 150–400)
RDW: 17.6 % — ABNORMAL HIGH (ref 11.5–15.5)

## 2012-04-02 LAB — MRSA PCR SCREENING: MRSA by PCR: NEGATIVE

## 2012-04-02 LAB — VITAMIN B12: Vitamin B-12: 673 pg/mL (ref 211–911)

## 2012-04-02 LAB — RETICULOCYTES
RBC.: 2.97 MIL/uL — ABNORMAL LOW (ref 3.87–5.11)
Retic Count, Absolute: 56.4 10*3/uL (ref 19.0–186.0)

## 2012-04-02 LAB — FOLATE: Folate: 10.6 ng/mL

## 2012-04-02 LAB — IRON AND TIBC
Iron: 11 ug/dL — ABNORMAL LOW (ref 42–135)
UIBC: 407 ug/dL — ABNORMAL HIGH (ref 125–400)

## 2012-04-02 LAB — HAPTOGLOBIN: Haptoglobin: 170 mg/dL (ref 45–215)

## 2012-04-02 MED ORDER — SODIUM CHLORIDE 0.9 % IV SOLN
1000.0000 mg | Freq: Once | INTRAVENOUS | Status: AC
  Administered 2012-04-02: 1000 mg via INTRAVENOUS
  Filled 2012-04-02: qty 20

## 2012-04-02 MED ORDER — GUAIFENESIN-DM 100-10 MG/5ML PO SYRP: 5.0000 mL | ORAL_SOLUTION | ORAL | Status: DC | PRN

## 2012-04-02 MED ORDER — SODIUM CHLORIDE 0.9 % IV SOLN
25.0000 mg | Freq: Once | INTRAVENOUS | Status: AC
  Administered 2012-04-02: 25 mg via INTRAVENOUS
  Filled 2012-04-02: qty 0.5

## 2012-04-02 NOTE — Progress Notes (Signed)
[facility] PROGRESS NOTE  [name] [name] ZOX:[id number] DOB: 08-13-1914 DOA: 04/01/2012 PCP: Kimber Relic, MD  Assessment/Plan: Principal Problem:  *Microcytic anemia Active Problems:  Hypertension  Pancreatic mass  Dementia  DOE (dyspnea on exertion)   Iron deficiency anemia -patient has microcytic anemia, iron studies showed ferritin of 5. -Has negative fecal occult blood testing in the ED x1.status post transfusion of 2 units of packed RBCs. -This is discussed in length with her 2 daughters, one of them is a Designer, jewellery in [employer]. -my plan was to ask gastroenterology to evaluate for possible EGD/colonoscopy. -Family wanted more conservative way of management. we will replete the iron through the IV form -They wanted to transfuse blood as needed, if patient developed overt bleeding and GI will be an option.  Dyspnea on exertion -This is likely secondary to the profound anemia.  Hypertension -she is hemodynamically stable, we'll monitor and hold as patient medications if needed.  Dementia -Continue outpatient medications.  History of pancreatic mass -Last CT scan was in June of 2009. This is also discussed with family as patient has more conservative way of thinking now we will only to ultrasound, knowing it is an inferior way of imaging to the pancreas.  Code Status: DO NOT RESUSCITATE Family Communication:2 daughters at bedside, plan explained to both Disposition Plan: likely back to the nursing home in one to 2 days   Brief narrative: 78 year old female with dementia, she is a nursing home resident came in to the hospital because of abnormal hemoglobin and dyspnea on exertion.  Consultants:  none  Procedures:  none  Antibiotics:  none  HPI/Subjective: Feels much better today after the transfusion.  Objective: Filed Vitals:   04/02/12 0610 04/02/12 0640 04/02/12 0740 04/02/12 0835  BP: 144/54 145/63 151/64 151/70  Pulse: 76 81  86 87  Temp: 97.7 F (36.5 C) 98.3 F (36.8 C) 97.9 F (36.6 C) 98.6 F (37 C)  TempSrc: Axillary Axillary Axillary Oral  Resp: 18 18 18 18   Height:      Weight:      SpO2:        Intake/Output Summary (Last 24 hours) at 04/02/12 1441 Last data filed at 04/02/12 0740  Gross per 24 hour  Intake   1525 ml  Output      0 ml  Net   1525 ml   Filed Weights   04/01/12 2220  Weight: 57.3 kg (126 lb 5.2 oz)    Exam:  General: Alert and awake, oriented x2, not in any acute distress. HEENT: anicteric sclera, pupils reactive to light and accommodation, EOMI CVS: S1-S2 clear, no murmur rubs or gallops Chest: clear to auscultation bilaterally, no wheezing, rales or rhonchi Abdomen: soft nontender, nondistended, normal bowel sounds, no organomegaly Extremities: no cyanosis, clubbing or edema noted bilaterally Neuro: Cranial nerves II-XII intact, no focal neurological deficits  Data Reviewed: Basic Metabolic Panel:  Lab 04/02/12 8119 04/01/12 1757  NA 135 135  K 4.0 3.8  CL 101 100  CO2 27 27  GLUCOSE 107* 111*  BUN 15 21  CREATININE 0.77 0.80  CALCIUM 9.1 9.4  MG -- --  PHOS -- --   Liver Function Tests: No results found for this basename: AST:5,ALT:5,ALKPHOS:5,BILITOT:5,PROT:5,ALBUMIN:5 in the last 168 hours No results found for this basename: LIPASE:5,AMYLASE:5 in the last 168 hours No results found for this basename: AMMONIA:5 in the last 168 hours CBC:  Lab 04/02/12 1130 04/01/12 1757  WBC 8.5 7.6  NEUTROABS --  4.5  HGB 10.1* 6.5*  HCT 31.2* 21.2*  MCV 69.6* 65.4*  PLT 304 378   Cardiac Enzymes: No results found for this basename: CKTOTAL:5,CKMB:5,CKMBINDEX:5,TROPONINI:5 in the last 168 hours BNP (last 3 results) No results found for this basename: PROBNP:3 in the last 8760 hours CBG: No results found for this basename: GLUCAP:5 in the last 168 hours  Recent Results (from the past 240 hour(s))  MRSA PCR SCREENING     Status: Normal   Collection Time    04/02/12 12:14 AM      Component Value Range Status Comment   MRSA by PCR NEGATIVE  NEGATIVE Final      Studies: Dg Chest 2 View  04/01/2012  *RADIOLOGY REPORT*  Clinical Data: Shortness of breath  CHEST - 2 VIEW  Comparison: January 25, 2011.  Findings: There is interval development of mild to moderate bilateral pleural effusions.  Underlying pneumonia or atelectasis cannot be excluded.  Bony thorax appears intact.  IMPRESSION: Interval development of mild to moderate bilateral pleural effusions.   Original Report Authenticated By: Venita Sheffield., M.D.     Scheduled Meds:   . atorvastatin  5 mg Oral Daily  . cholecalciferol  1,000 Units Oral Daily  . memantine  10 mg Oral BID  . metoprolol tartrate  25 mg Oral BID  . polyethylene glycol  17 g Oral QODAY  . senna-docusate  2 tablet Oral QHS  . sodium chloride  3 mL Intravenous Q12H   Continuous Infusions:   . sodium chloride 20 mL/hr at 04/01/12 2334    Principal Problem:  *Microcytic anemia Active Problems:  Hypertension  Pancreatic mass  Dementia  DOE (dyspnea on exertion)    Time spent: 35 miutes    [facility]  [facility] Pager [phone]. If 8PM-8AM, please contact night-coverage at [email or web address], password [facility] 04/02/2012, 2:41 PM  LOS: 1 day

## 2012-04-03 ENCOUNTER — Inpatient Hospital Stay (HOSPITAL_COMMUNITY): Payer: Medicare Other

## 2012-04-03 DIAGNOSIS — D509 Iron deficiency anemia, unspecified: Principal | ICD-10-CM

## 2012-04-03 LAB — TYPE AND SCREEN
ABO/RH(D): O POS
Antibody Screen: NEGATIVE

## 2012-04-03 LAB — CBC
Hemoglobin: 11.5 g/dL — ABNORMAL LOW (ref 12.0–15.0)
MCH: 22.9 pg — ABNORMAL LOW (ref 26.0–34.0)
Platelets: 336 10*3/uL (ref 150–400)
RBC: 5.02 MIL/uL (ref 3.87–5.11)
WBC: 9.7 10*3/uL (ref 4.0–10.5)

## 2012-04-03 LAB — BASIC METABOLIC PANEL
Calcium: 9.2 mg/dL (ref 8.4–10.5)
GFR calc Af Amer: 82 mL/min — ABNORMAL LOW (ref >90)
GFR calc non Af Amer: 71 mL/min — ABNORMAL LOW (ref >90)
Glucose, Bld: 107 mg/dL — ABNORMAL HIGH (ref 70–99)
Potassium: 3.7 mEq/L (ref 3.5–5.1)
Sodium: 136 mEq/L (ref 135–145)

## 2012-04-03 MED ORDER — FERROUS SULFATE 325 (65 FE) MG PO TABS: 325.0000 mg | ORAL_TABLET | Freq: Every day | ORAL | Status: DC

## 2012-04-03 NOTE — Progress Notes (Signed)
Clinical Social Work Department BRIEF PSYCHOSOCIAL ASSESSMENT 04/03/2012  Patient:  [name] [name], [name] [name]     Account Number:  [id number]     Admit date:  04/01/2012  Clinical Social Worker:  Leron Croak, CLINICAL SOCIAL WORKER  Date/Time:  04/03/2012 07:09 PM  Referred by:  Physician  Date Referred:  04/03/2012 Referred for  SNF Placement   Other Referral:   Interview type:  Patient Other interview type:    PSYCHOSOCIAL DATA Living Status:  FACILITY Admitted from facility:  [facility] Level of care:  Skilled Nursing Facility Primary support name:  Malaina Mortellaro Primary support relationship to patient:  CHILD, ADULT Degree of support available:   good    CURRENT CONCERNS Current Concerns  Post-Acute Placement   Other Concerns:    SOCIAL WORK ASSESSMENT / PLAN CSW spoke with son and facility to confirm Pt was to return. Both confirmed and Pt was d/c to [facility].   Assessment/plan status:  Other - See comment Other assessment/ plan:   D/C'd to facility   Information/referral to community resources:   none needed    PATIENT'S/FAMILY'S RESPONSE TO PLAN OF CARE: Son appreicative for phone call and assistance      Kiri Hinderliter Marvis [employer] Weekend Coverage [phone]

## 2012-04-03 NOTE — Discharge Summary (Signed)
Physician Discharge Summary  [name] [name] ZOX:[id number] DOB: 10-Jun-1915 DOA: 04/01/2012  PCP: Kimber Relic, MD  Admit date: 04/01/2012 Discharge date: 04/03/2012  Recommendations for Outpatient Follow-up:  1. Follow CBC closely as outpatient  Discharge Diagnoses:  Principal Problem:  *Iron deficiency anemia Active Problems:  Hypertension  Pancreatic mass  Dementia  DOE (dyspnea on exertion)   Discharge Condition: Stable  Diet recommendation: Regular  Filed Weights   04/01/12 2220  Weight: 57.3 kg (126 lb 5.2 oz)    History of present illness:  The patient is a 78 year old white female resident of [facility] with past medical history significant for dementia, pancreatic mass in the past-2009, who presents with above complaints. The history is obtained from her son and daughter at the bedside as well as SNF records due to patient's dementia. They report that patient appeared to be getting 'winded' with exertion at the nursing facility, weakness and appeared to be pale and so blood work was a done and showed a hemoglobin of 7.1 and she was brought to the ED for further evaluation and management. The patient denies any symptoms and does not understand why she was brought to the ED. Per family she's not had any nausea vomiting, abd pain, hematemesis, melena and no hematochezia, also no reports of dizziness or chest pain.in the ED a chest x-ray was done which showed interval development of mild-to-moderate bilateral pleural effusions,hemoglobin was done and came back at 6.5,stool guaiac and ED was negative, and 2 units of packed red blood cells ordered per EDP and admission for further evaluation and management requested.  As noted above patient's CT in 2009 revealed a pancreatic mass and per discharge summary she was to follow up with Dr. Elnoria Howard outpatient, daughter states she did followup with Dr. Elnoria Howard but that she never heard anything else concerning the mass,but she  also states that they had discussed that they did not want any invasive measures/interventions.she states that they would like to find out the cause of her anemia.she confirms that patient is a DO NOT RESUSCITATE.   Hospital Course:   1. Iron deficiency anemia: Patient brought to the hospital with hemoglobin of 6.5. According to hospital records in June of 2012 hemoglobin of 13.3. Patient did have recent hemoglobin was checked and March of 2013 which was 12.0. Iron studies showed also severe iron deficiency with iron all 11 and ferritin of 5. Her situation discussed with her 2 daughters, one of them is a Engineer, civil (consulting) in [place]. Family and patient wanted to go for more conservative way of management, patient has 2 units of blood transfused, she also has 1 g of IV iron. He did not want to do any invasive testing like EGD/colonoscopy. Followup as outpatient if patient has a third bleeding or her hemoglobin starts to drop rapidly she might need to be reevaluated by a gastroenterologist. Hemoglobin stable today, patient denies any symptoms felt to be appropriate to be discharged back to her skilled nursing facility. Oral iron supplements also added.  2. Dyspnea on exertion: This is resolved, but likely secondary to her low blood counts and anemia.  5. Hypertension: Patient is taken blood pressure medications which were held at the time of admission secondary to her anemia. She was hemodynamically stable blood pressure medications were restarted at the time of discharge.  4. Dementia: This is secondary to her advanced age, patient is stable does not have any acute symptoms of delirium or disorientation. Her medications  continued throughout the hospital stay.  Procedures:  None  Consultations:  None  Discharge Exam: Filed Vitals:   04/03/12 0508  BP: 152/90  Pulse: 108  Temp: 97.5 F (36.4 C)  Resp: 22   Filed Vitals:   04/02/12 0835 04/02/12 1557 04/02/12 2142 04/03/12 0508  BP:  151/70 154/74 166/74 152/90  Pulse: 87 69 76 108  Temp: 98.6 F (37 C) 98 F (36.7 C) 98.1 F (36.7 C) 97.5 F (36.4 C)  TempSrc: Oral Oral Axillary Axillary  Resp: 18 18 23 22   Height:      Weight:      SpO2:  94% 91% 90%   General: Alert and awake, oriented x2, not in any acute distress. HEENT: anicteric sclera, pupils reactive to light and accommodation, EOMI CVS: S1-S2 clear, no murmur rubs or gallops Chest: clear to auscultation bilaterally, no wheezing, rales or rhonchi Abdomen: soft nontender, nondistended, normal bowel sounds, no organomegaly Extremities: no cyanosis, clubbing or edema noted bilaterally Neuro: Cranial nerves II-XII intact, no focal neurological deficits   Discharge Instructions  Discharge Orders    Future Orders Please Complete By Expires   Diet - low sodium heart healthy      Increase activity slowly        Medication List  As of 04/03/2012 12:24 PM   TAKE these medications         atorvastatin 10 MG tablet   Commonly known as: LIPITOR   Take 5 mg by mouth daily.      CALCIUM 600 + D PO   Take 1 tablet by mouth daily.      cholecalciferol 1000 UNITS tablet   Commonly known as: VITAMIN D   Take 1,000 Units by mouth daily.      ferrous sulfate 325 (65 FE) MG tablet   Take 1 tablet (325 mg total) by mouth daily with breakfast.      memantine 10 MG tablet   Commonly known as: NAMENDA   Take 10 mg by mouth 2 (two) times daily.      metoprolol tartrate 25 MG tablet   Commonly known as: LOPRESSOR   Take 25 mg by mouth 2 (two) times daily. Hold for BP < 90/60 or HR < 50.      polyethylene glycol packet   Commonly known as: MIRALAX / GLYCOLAX   Take 17 g by mouth every other day.      senna-docusate 8.6-50 MG per tablet   Commonly known as: Senokot-S   Take 2 tablets by mouth at bedtime.      vitamin A 08657 UNIT capsule   Take 20,000 Units by mouth daily.              The results of significant diagnostics from this  hospitalization (including imaging, microbiology, ancillary and laboratory) are listed below for reference.    Significant Diagnostic Studies: Dg Chest 2 View  04/01/2012  *RADIOLOGY REPORT*  Clinical Data: Shortness of breath  CHEST - 2 VIEW  Comparison: January 25, 2011.  Findings: There is interval development of mild to moderate bilateral pleural effusions.  Underlying pneumonia or atelectasis cannot be excluded.  Bony thorax appears intact.  IMPRESSION: Interval development of mild to moderate bilateral pleural effusions.   Original Report Authenticated By: Venita Sheffield., M.D.    US Abdomen Complete  04/03/2012  *RADIOLOGY REPORT*  Clinical Data:  78 year old female with abdominal pain.  History of pancreatic abnormality identified on 2009 CT.  ABDOMINAL ULTRASOUND COMPLETE  Comparison:  02/01/2008 abdomen/pelvic CT. 02/03/2008 abdominal ultrasound  Findings:  Gallbladder: Gallbladder wall thickening is noted with ring down artifact compatible with adenomyomatosis.  There is no evidence of cholelithiasis or cholecystitis.  Common Bile Duct:  There is no evidence of intrahepatic or extrahepatic biliary dilation. The CBD measures 6.1 mm in greatest diameter.  Liver:  The liver is within normal limits in parenchymal echogenicity. No focal abnormalities are identified.  IVC:  Appears normal.  Pancreas:  Although the pancreas is difficult to visualize in its entirety, no focal pancreatic abnormality is identified. The cystic lesion within the pancreas identified on 2009 is not well visualized on this ultrasound.  Spleen:  Within normal limits in size and echotexture.  Right kidney:  The right kidney is normal in size and parenchymal echogenicity.  There is no evidence of solid mass, hydronephrosis or definite renal calculi.  The right kidney measures 10.1 cm.  Left kidney:  The left kidney is normal in size and parenchymal echogenicity.  There is no evidence of solid mass, hydronephrosis or definite renal  calculi.   The left kidney measures 10.4 cm.  Abdominal Aorta:  No abdominal aortic aneurysm identified.  There is no evidence of ascites. Bilateral pleural effusions are identified.  IMPRESSION: Pancreatic lesion identified on 02/01/2008 is not visualized on today's study, but the pancreas is not well evaluated secondary to overlying bowel gas.  Given characteristics on prior CT, the pancreatic abnormality is likely an indolent neoplasm with little clinical significance.  Bilateral pleural effusions.  Adenomyomatosis.   Original Report Authenticated By: Rosendo Gros, M.D.     Microbiology: Recent Results (from the past 240 hour(s))  MRSA PCR SCREENING     Status: Normal   Collection Time   04/02/12 12:14 AM      Component Value Range Status Comment   MRSA by PCR NEGATIVE  NEGATIVE Final      Labs: Basic Metabolic Panel:  Lab 04/03/12 4098 04/02/12 1130 04/01/12 1757  NA 136 135 135  K 3.7 4.0 3.8  CL 102 101 100  CO2 25 27 27   GLUCOSE 107* 107* 111*  BUN 11 15 21   CREATININE 0.71 0.77 0.80  CALCIUM 9.2 9.1 9.4  MG -- -- --  PHOS -- -- --   Liver Function Tests: No results found for this basename: AST:5,ALT:5,ALKPHOS:5,BILITOT:5,PROT:5,ALBUMIN:5 in the last 168 hours No results found for this basename: LIPASE:5,AMYLASE:5 in the last 168 hours No results found for this basename: AMMONIA:5 in the last 168 hours CBC:  Lab 04/03/12 0510 04/02/12 1130 04/01/12 1757  WBC 9.7 8.5 7.6  NEUTROABS -- -- 4.5  HGB 11.5* 10.1* 6.5*  HCT 35.1* 31.2* 21.2*  MCV 69.9* 69.6* 65.4*  PLT 336 304 378   Cardiac Enzymes: No results found for this basename: CKTOTAL:5,CKMB:5,CKMBINDEX:5,TROPONINI:5 in the last 168 hours BNP: BNP (last 3 results) No results found for this basename: PROBNP:3 in the last 8760 hours CBG: No results found for this basename: GLUCAP:5 in the last 168 hours  Time coordinating discharge: 40 minutes  Signed:  Luverta Korte A  [facility] 04/03/2012, 12:24  PM

## 2012-04-03 NOTE — Progress Notes (Signed)
Pt to be d/c today to [facility] SNF.   Pt and family agreeable. Confirmed plans with facility.  Plan transfer via daughter Rise Mu) to facility.  Leron Croak, LCSWA [employer] [phone]

## 2012-06-17 ENCOUNTER — Emergency Department (HOSPITAL_COMMUNITY): Payer: Medicare Other

## 2012-06-17 ENCOUNTER — Encounter (HOSPITAL_COMMUNITY): Payer: Self-pay | Admitting: Emergency Medicine

## 2012-06-17 ENCOUNTER — Emergency Department (HOSPITAL_COMMUNITY)
Admission: EM | Admit: 2012-06-17 | Discharge: 2012-06-17 | Disposition: A | Discharge status: Home / Self Care | Payer: Medicare Other | Attending: Emergency Medicine | Admitting: Emergency Medicine

## 2012-06-17 DIAGNOSIS — Z79899 Other long term (current) drug therapy: Secondary | ICD-10-CM | POA: Insufficient documentation

## 2012-06-17 DIAGNOSIS — I10 Essential (primary) hypertension: Secondary | ICD-10-CM | POA: Insufficient documentation

## 2012-06-17 DIAGNOSIS — Z7982 Long term (current) use of aspirin: Secondary | ICD-10-CM | POA: Insufficient documentation

## 2012-06-17 DIAGNOSIS — E785 Hyperlipidemia, unspecified: Secondary | ICD-10-CM | POA: Insufficient documentation

## 2012-06-17 DIAGNOSIS — Z8544 Personal history of malignant neoplasm of other female genital organs: Secondary | ICD-10-CM | POA: Insufficient documentation

## 2012-06-17 DIAGNOSIS — I4891 Unspecified atrial fibrillation: Secondary | ICD-10-CM | POA: Insufficient documentation

## 2012-06-17 DIAGNOSIS — Z8739 Personal history of other diseases of the musculoskeletal system and connective tissue: Secondary | ICD-10-CM | POA: Insufficient documentation

## 2012-06-17 DIAGNOSIS — Z85828 Personal history of other malignant neoplasm of skin: Secondary | ICD-10-CM | POA: Insufficient documentation

## 2012-06-17 DIAGNOSIS — S299XXA Unspecified injury of thorax, initial encounter: Secondary | ICD-10-CM

## 2012-06-17 DIAGNOSIS — W19XXXA Unspecified fall, initial encounter: Secondary | ICD-10-CM | POA: Insufficient documentation

## 2012-06-17 DIAGNOSIS — Y939 Activity, unspecified: Secondary | ICD-10-CM | POA: Insufficient documentation

## 2012-06-17 DIAGNOSIS — S298XXA Other specified injuries of thorax, initial encounter: Secondary | ICD-10-CM | POA: Insufficient documentation

## 2012-06-17 DIAGNOSIS — Y9210 Unspecified residential institution as the place of occurrence of the external cause: Secondary | ICD-10-CM | POA: Insufficient documentation

## 2012-06-17 DIAGNOSIS — F0390 Unspecified dementia without behavioral disturbance: Secondary | ICD-10-CM | POA: Insufficient documentation

## 2012-06-17 LAB — COMPREHENSIVE METABOLIC PANEL
Albumin: 3.3 g/dL — ABNORMAL LOW (ref 3.5–5.2)
Alkaline Phosphatase: 80 U/L (ref 39–117)
BUN: 17 mg/dL (ref 6–23)
Chloride: 100 mEq/L (ref 96–112)
Creatinine, Ser: 0.86 mg/dL (ref 0.50–1.10)
GFR calc Af Amer: 64 mL/min — ABNORMAL LOW (ref >90)
GFR calc non Af Amer: 56 mL/min — ABNORMAL LOW (ref >90)
Glucose, Bld: 115 mg/dL — ABNORMAL HIGH (ref 70–99)
Total Bilirubin: 0.5 mg/dL (ref 0.3–1.2)

## 2012-06-17 LAB — CBC
HCT: 35.1 % — ABNORMAL LOW (ref 36.0–46.0)
Hemoglobin: 11.4 g/dL — ABNORMAL LOW (ref 12.0–15.0)
MCV: 81.6 fL (ref 78.0–100.0)
RBC: 4.3 MIL/uL (ref 3.87–5.11)
RDW: 20.2 % — ABNORMAL HIGH (ref 11.5–15.5)
WBC: 11.8 10*3/uL — ABNORMAL HIGH (ref 4.0–10.5)

## 2012-06-17 LAB — POCT I-STAT TROPONIN I: Troponin i, poc: 0.01 ng/mL (ref 0.00–0.08)

## 2012-06-17 LAB — URINALYSIS, ROUTINE W REFLEX MICROSCOPIC
Glucose, UA: NEGATIVE mg/dL
Ketones, ur: NEGATIVE mg/dL
Leukocytes, UA: NEGATIVE
Nitrite: NEGATIVE
Protein, ur: NEGATIVE mg/dL
Urobilinogen, UA: 0.2 mg/dL (ref 0.0–1.0)

## 2012-06-17 LAB — POCT I-STAT, CHEM 8
Calcium, Ion: 1.31 mmol/L — ABNORMAL HIGH (ref 1.13–1.30)
HCT: 36 % (ref 36.0–46.0)
Hemoglobin: 12.2 g/dL (ref 12.0–15.0)
Sodium: 141 mEq/L (ref 135–145)
TCO2: 28 mmol/L (ref 0–100)

## 2012-06-17 MED ORDER — SODIUM CHLORIDE 0.9 % IV BOLUS (SEPSIS)
1000.0000 mL | Freq: Once | INTRAVENOUS | Status: AC
  Administered 2012-06-17: 1000 mL via INTRAVENOUS

## 2012-06-17 NOTE — ED Notes (Signed)
Per EMS. Patient has had three falls this week, most recent at 0700 on 06/16/2012, no loc. Pt uses walker at home, slipped and fell over the side of her walker. L sided rib pain, no bruising noted. Patient has hx of dementia. Also, patient cheeks are reddened per facility.

## 2012-06-17 NOTE — ED Notes (Signed)
[facility] contacted for transport back to [facility]. dph

## 2012-06-17 NOTE — ED Notes (Signed)
ZOX:[id number]<AV> Expected date:06/17/12<BR> Expected time: 2:01 AM<BR> Means of arrival:Ambulance<BR> Comments:<BR> fall

## 2012-06-17 NOTE — ED Notes (Signed)
Patient transported to X-ray 

## 2012-06-17 NOTE — ED Notes (Signed)
Patient family states patient is on nectar thickened liquids.

## 2012-06-17 NOTE — Clinical Social Work Note (Signed)
CSW noticed that pt was from [facility], SNF.  CSW f/u with pt family (son-in-law) to assess need before returning back to facility.  No need recognized.  CSW signing off. Vickii Penna, LCSWA [phone]  Clinical Social Work

## 2012-06-17 NOTE — ED Notes (Signed)
In and Out Cath attempted by [place], NT. Unable to obtain a specimen. MD notified.

## 2012-06-17 NOTE — ED Provider Notes (Signed)
History     CSN: [id number]  Arrival date & time 06/17/12  0207   First MD Initiated Contact with Patient 06/17/12 0234      Chief Complaint  Patient presents with  . Fall    (Consider location/radiation/quality/duration/timing/severity/associated sxs/prior treatment) HPI HX per PT and family. Lives in a nursing home, uses a walker and also spends a lot of time in a wheelchair. She has been having inc falls over the last week. No F/C, no LOC, complaining of rib pain when she moves. Has dementia, no blood thinners, pain sharp and shooting and not radiating. No known head injury and PT cant recall if she has hit her head or not - she has had compression fractures and other injuries in the past and can only take tylenol per family bedside - she declines any pain meds at this time.  Past Medical History  Diagnosis Date  . Hypertension   . Hyperlipidemia   . Cancer     uterine  . Skin cancer     basal, squamous cell - radiation  . Compression fx, thoracic spine 01/22/2008    fx T9 - on bedrest x 6 months  . Osteopenia   . Dementia   . Pancreatic mass approx. 2009     Past Surgical History  Procedure Date  . Abdominal hysterectomy     for ca    History reviewed. No pertinent family history.  History  Substance Use Topics  . Smoking status: Never Smoker   . Smokeless tobacco: Never Used  . Alcohol Use: No    OB History    Grav Para Term Preterm Abortions TAB SAB Ect Mult Living                  Review of Systems  Constitutional: Negative for fever and chills.  HENT: Negative for neck pain and neck stiffness.   Eyes: Negative for pain.  Respiratory: Negative for cough and shortness of breath.   Cardiovascular: Negative for palpitations and leg swelling.  Gastrointestinal: Negative for abdominal pain.  Genitourinary: Negative for dysuria.  Musculoskeletal: Negative for back pain.  Skin: Negative for rash and wound.  Neurological: Negative for headaches.  All  other systems reviewed and are negative.    Allergies  Actonel; Fosamax; Lortab; Masoprocol; Morphine and related; Niacin and related; Phenergan; Quinidine; and Quinine derivatives  Home Medications   Current Outpatient Rx  Name  Route  Sig  Dispense  Refill  . ASPIRIN 81 MG PO CHEW   Oral   Chew 81 mg by mouth daily.         . ATORVASTATIN CALCIUM 10 MG PO TABS   Oral   Take 5 mg by mouth daily.         Marland Kitchen CALCIUM 600 + D PO   Oral   Take 1 tablet by mouth daily.         Marland Kitchen VITAMIN D 1000 UNITS PO TABS   Oral   Take 1,000 Units by mouth daily.         Marland Kitchen FERROUS SULFATE 325 (65 FE) MG PO TABS   Oral   Take 1 tablet (325 mg total) by mouth daily with breakfast.         . MEMANTINE HCL 10 MG PO TABS   Oral   Take 10 mg by mouth 2 (two) times daily.         Marland Kitchen METOPROLOL TARTRATE 25 MG PO TABS   Oral  Take 25 mg by mouth 2 (two) times daily. Hold for BP < 90/60 or HR < 50.         Marland Kitchen SENNOSIDES-DOCUSATE SODIUM 8.6-50 MG PO TABS   Oral   Take 2 tablets by mouth at bedtime.         Marland Kitchen VITAMIN A 16109 UNITS PO CAPS   Oral   Take 20,000 Units by mouth daily.         Marland Kitchen POLYETHYLENE GLYCOL 3350 PO PACK   Oral   Take 17 g by mouth every other day.           BP 126/76  Pulse 78  Temp 99 F (37.2 C) (Oral)  Resp 18  SpO2 95%  Physical Exam  Constitutional: She appears well-developed and well-nourished.  HENT:  Head: Normocephalic and atraumatic.  Eyes: Conjunctivae normal and EOM are normal. Pupils are equal, round, and reactive to light.  Neck: Trachea normal. Neck supple.       No cervical tenderness or deformity  Cardiovascular: Normal rate, regular rhythm, S1 normal, S2 normal and normal pulses.     No systolic murmur is present   No diastolic murmur is present  Pulses:      Radial pulses are 2+ on the right side, and 2+ on the left side.  Pulmonary/Chest: Effort normal and breath sounds normal. She has no wheezes. She has no rhonchi.  She has no rales.       L chest wall tenderness, no crepitus or ecchymosis  Abdominal: Soft. Normal appearance and bowel sounds are normal. There is no tenderness. There is no CVA tenderness and negative Murphy's sign.  Musculoskeletal:       BLE:s Calves nontender, no cords or erythema, negative Homans sign  Neurological: She is alert. She has normal strength. No cranial nerve deficit or sensory deficit. GCS eye subscore is 4. GCS verbal subscore is 5. GCS motor subscore is 6.       A/O x 1  Skin: Skin is warm and dry. No rash noted. She is not diaphoretic.  Psychiatric: Her speech is normal.       Cooperative and appropriate    ED Course  Procedures (including critical care time)  Labs Reviewed  CBC - Abnormal; Notable for the following:    WBC 11.8 (*)     Hemoglobin 11.4 (*)     HCT 35.1 (*)     RDW 20.2 (*)     All other components within normal limits  COMPREHENSIVE METABOLIC PANEL - Abnormal; Notable for the following:    Glucose, Bld 115 (*)     Albumin 3.3 (*)     GFR calc non Af Amer 56 (*)     GFR calc Af Amer 64 (*)     All other components within normal limits  POCT I-STAT, CHEM 8 - Abnormal; Notable for the following:    Glucose, Bld 113 (*)     Calcium, Ion 1.31 (*)     All other components within normal limits  POCT I-STAT TROPONIN I  URINALYSIS, ROUTINE W REFLEX MICROSCOPIC   Dg Chest 2 View  06/17/2012  *RADIOLOGY REPORT*  Clinical Data: Status post fall; concern for chest injury.  CHEST - 2 VIEW  Comparison: Chest radiograph performed 04/01/2012  Findings: Small bilateral pleural effusions are noted, slightly less prominent than on the prior study.  Mild vascular congestion is noted, and mild bibasilar airspace opacities are seen.  No pneumothorax is seen.  The  cardiomediastinal silhouette is borderline enlarged; calcification is noted within the aortic arch.  No acute osseous abnormalities are identified.  IMPRESSION:  1.  Mild vascular congestion and  borderline cardiomegaly; small bilateral pleural effusions, slightly less prominent than on the prior study.  Mild bibasilar airspace opacities may reflect atelectasis. 2.  No displaced rib fractures seen.   Original Report Authenticated By: Tonia Ghent, M.D.    Ct Head Wo Contrast  06/17/2012  *RADIOLOGY REPORT*  Clinical Data:  Status post three falls; concern for head or cervical spine injury.  CT HEAD WITHOUT CONTRAST AND CT CERVICAL SPINE WITHOUT CONTRAST  Technique:  Multidetector CT imaging of the head and cervical spine was performed following the standard protocol without intravenous contrast.  Multiplanar CT image reconstructions of the cervical spine were also generated.  Comparison: CT of the head and cervical spine performed 01/22/2008  CT HEAD  Findings: There is no evidence of acute infarction, mass lesion, or intra- or extra-axial hemorrhage on CT.  Prominence of the ventricles and sulci reflects moderate cortical volume loss.  Diffuse periventricular and subcortical white matter change likely reflects small vessel ischemic microangiopathy. Cerebellar atrophy is noted.  The brainstem and fourth ventricle are within normal limits.  The basal ganglia are unremarkable in appearance.  The cerebral hemispheres demonstrate grossly normal gray-white differentiation. No mass effect or midline shift is seen.  There is no evidence of fracture; visualized osseous structures are unremarkable in appearance.  The orbits are within normal limits. The paranasal sinuses and mastoid air cells are well-aerated.  No significant soft tissue abnormalities are seen.  IMPRESSION:  1.  No evidence of traumatic intracranial injury or fracture. 2.  Moderate cortical volume loss and diffuse small vessel ischemic microangiopathy.  CT CERVICAL SPINE  Findings: There is no evidence of fracture or subluxation.  There is diffuse osseous fusion of C4-T2, reflecting large flowing osteophytes.  Associated multilevel disc space  narrowing is noted. Prevertebral soft tissues are within normal limits.  The thyroid gland is unremarkable in appearance.  A small right- sided pleural effusion is noted.  Calcification is noted at the carotid bifurcations bilaterally.  IMPRESSION:  1.  No evidence of fracture or subluxation along the cervical spine.  2.  Prominent flowing osteophytes correspond to osseous fusion of C4-T2.   Original Report Authenticated By: Tonia Ghent, M.D.    Ct Cervical Spine Wo Contrast  06/17/2012  *RADIOLOGY REPORT*  Clinical Data:  Status post three falls; concern for head or cervical spine injury.  CT HEAD WITHOUT CONTRAST AND CT CERVICAL SPINE WITHOUT CONTRAST  Technique:  Multidetector CT imaging of the head and cervical spine was performed following the standard protocol without intravenous contrast.  Multiplanar CT image reconstructions of the cervical spine were also generated.  Comparison: CT of the head and cervical spine performed 01/22/2008  CT HEAD  Findings: There is no evidence of acute infarction, mass lesion, or intra- or extra-axial hemorrhage on CT.  Prominence of the ventricles and sulci reflects moderate cortical volume loss.  Diffuse periventricular and subcortical white matter change likely reflects small vessel ischemic microangiopathy. Cerebellar atrophy is noted.  The brainstem and fourth ventricle are within normal limits.  The basal ganglia are unremarkable in appearance.  The cerebral hemispheres demonstrate grossly normal gray-white differentiation. No mass effect or midline shift is seen.  There is no evidence of fracture; visualized osseous structures are unremarkable in appearance.  The orbits are within normal limits. The paranasal sinuses and mastoid air cells are  well-aerated.  No significant soft tissue abnormalities are seen.  IMPRESSION:  1.  No evidence of traumatic intracranial injury or fracture. 2.  Moderate cortical volume loss and diffuse small vessel ischemic microangiopathy.   CT CERVICAL SPINE  Findings: There is no evidence of fracture or subluxation.  There is diffuse osseous fusion of C4-T2, reflecting large flowing osteophytes.  Associated multilevel disc space narrowing is noted. Prevertebral soft tissues are within normal limits.  The thyroid gland is unremarkable in appearance.  A small right- sided pleural effusion is noted.  Calcification is noted at the carotid bifurcations bilaterally.  IMPRESSION:  1.  No evidence of fracture or subluxation along the cervical spine.  2.  Prominent flowing osteophytes correspond to osseous fusion of C4-T2.   Original Report Authenticated By: Tonia Ghent, M.D.      Date: 06/17/2012  Rate: 109  Rhythm: a fib  QRS Axis: normal  Intervals: normal  ST/T Wave abnormalities: nonspecific ST changes  Conduction Disutrbances:none  Narrative Interpretation: NSR previous  Old EKG Reviewed: changes noted  HR improved to 90s in the ED - no rapid afib. No evidence of infx or other etiology for falls. Labs, UA and imaging reviewed. unk onset of afib.   7:05 AM CAR consult, d/w Dr Melburn Popper, is rate controlled, new onset a fib, not a candidate for anticoagulation, already on Lopressor.   MDM   Multiple falls, L rib contusion versus occult Fx, also found to be in new onset afib. Plan outpatient follow up, family prefers to defer to PCP DR Chilton Si who knows PT well regarding pain medication RX. PT plans to take tylenol as needed until that time.         Sunnie Nielsen, MD 06/17/12 [phone]

## 2012-08-04 LAB — BASIC METABOLIC PANEL
BUN: 15 mg/dL (ref 4–21)
Creatinine: 0.8 mg/dL (ref 0.5–1.1)
Potassium: 4.1 mmol/L (ref 3.4–5.3)

## 2012-11-10 ENCOUNTER — Non-Acute Institutional Stay (SKILLED_NURSING_FACILITY): Payer: Medicare Other | Admitting: Nurse Practitioner

## 2012-11-10 DIAGNOSIS — K5900 Constipation, unspecified: Secondary | ICD-10-CM

## 2012-11-10 DIAGNOSIS — M810 Age-related osteoporosis without current pathological fracture: Secondary | ICD-10-CM

## 2012-11-10 DIAGNOSIS — D509 Iron deficiency anemia, unspecified: Secondary | ICD-10-CM

## 2012-11-10 DIAGNOSIS — I10 Essential (primary) hypertension: Secondary | ICD-10-CM

## 2012-11-10 DIAGNOSIS — J449 Chronic obstructive pulmonary disease, unspecified: Secondary | ICD-10-CM

## 2012-11-10 DIAGNOSIS — K8689 Other specified diseases of pancreas: Secondary | ICD-10-CM

## 2012-11-10 DIAGNOSIS — F0390 Unspecified dementia without behavioral disturbance: Secondary | ICD-10-CM

## 2012-11-10 DIAGNOSIS — M8448XD Pathological fracture, other site, subsequent encounter for fracture with routine healing: Secondary | ICD-10-CM

## 2012-11-10 DIAGNOSIS — E785 Hyperlipidemia, unspecified: Secondary | ICD-10-CM

## 2012-11-10 DIAGNOSIS — K869 Disease of pancreas, unspecified: Secondary | ICD-10-CM

## 2012-11-10 LAB — CBC AND DIFFERENTIAL: Platelets: 235 10*3/uL (ref 150–399)

## 2012-11-10 NOTE — Progress Notes (Signed)
Subjective:    Patient ID: [name] [name], female    DOB: [birth year], 79 y.o.   MRN: [id number]  HPI   Hgb dropped from 12.0 08/04/13 to 10.5 09/26/12 with low MCT, MCV, MCH. Hx of resolved IDA and off Iron since 08/23/12--Iron resumed 09/27/12, Hgb improved 13.1 10/17/12, MCV/MCH still low.  New onset A-fib--HR 90s and she is asymptomatic cardiac wise--Dr. Melburn Popper consultation: not a candidate for anticoagulation and already on Lopressor.  MMSE 10/30 05/23/12 NEOPLASM, PANCREAS  stable. See CT report 2009. Patient had another ultrasound while hospitalized in August 2013. The pancreas was difficult to visualize, but no focal pancreatic abnormality was identified. A previous cystic lesion within the pancreas identified in 2009 was not seen. HYPERLIPIDEMIA The patient's most recent LDL is at goal.Off Atorvastatin.  ANEMIA, BLOOD LOSS ACUTE   Most recent hospitalized, resolved and off Iron now. Hgb 12.0 08/04/13--worse since OfIron 08/23/12-resumed 2/181/4--Hgb 10.5 09/26/12--13.1 10/17/12, Iron resumed-Hgb 12/5 11/10/12 DEMENTIA, SENILE  progressing,  on Namenda. MMSE 10/30 05/23/12 HTN UNSPECIFIED The blood pressure readings taken outside the office since the last visit have been in the target range.controlled on Metoprolol.  COPD  stable. No longer need Albuterol/Atrovent Neb.  CONSTIPATION The medication is well tolerated.no problem, taking MiraLax and Senna OSTEOPOROSIS  hx of  Forteo(started 01/2008) and Ca. On Ca and Vit D VERTEBRAL COMP FX The pain has improved.healed   Review of Systems  Constitutional: Negative for fever, chills, diaphoresis, activity change, appetite change and fatigue.  HENT: Positive for hearing loss. Negative for congestion, rhinorrhea, trouble swallowing, neck pain, neck stiffness, voice change, postnasal drip and sinus pressure.   Eyes: Negative.   Respiratory: Negative for cough, choking, chest tightness, shortness of breath and wheezing.   Cardiovascular: Negative for  chest pain, palpitations and leg swelling.  Gastrointestinal: Negative for nausea, vomiting, constipation, blood in stool and abdominal distention.  Endocrine: Negative.   Genitourinary: Positive for frequency (incontinent of bladder). Negative for dysuria, urgency, flank pain and pelvic pain.  Musculoskeletal: Positive for back pain, arthralgias and gait problem. Negative for joint swelling.  Skin: Negative for rash and wound.       Extremely dry and scaly and lots of AK   Allergic/Immunologic: Negative.   Neurological: Negative for tremors, seizures, speech difficulty, weakness, light-headedness and numbness.  Hematological: Negative.   Psychiatric/Behavioral: Positive for confusion (related to dementia). Negative for hallucinations, behavioral problems, sleep disturbance, dysphoric mood and agitation. The patient is not nervous/anxious and is not hyperactive.        Objective:   Physical Exam  Constitutional: She appears well-developed and well-nourished.  HENT:  Head: Normocephalic and atraumatic.  Eyes: Conjunctivae and EOM are normal. Pupils are equal, round, and reactive to light.  Neck: Normal range of motion. Neck supple. No JVD present. No thyromegaly present.  Cardiovascular: Normal rate, regular rhythm and normal heart sounds.   No murmur heard. Pulmonary/Chest: Effort normal. She has decreased breath sounds. She has no wheezes. She has no rales.  Abdominal: Soft. Bowel sounds are normal. There is no tenderness.  Musculoskeletal: Normal range of motion. She exhibits no edema and no tenderness.  Lymphadenopathy:    She has no cervical adenopathy.  Neurological: She is alert. She has normal reflexes. She displays normal reflexes. No cranial nerve deficit. She exhibits abnormal muscle tone. Coordination normal.  Skin: Skin is warm and dry.  Lots of AK or cutaneous horns  Psychiatric: Her mood appears not anxious. Her affect is not angry, not blunt, not  labile and not  inappropriate. Her speech is delayed. Her speech is not slurred. She is slowed. She is not agitated, not aggressive, not hyperactive, not withdrawn, not actively hallucinating and not combative. Thought content is not paranoid and not delusional. Cognition and memory are impaired. She expresses impulsivity and inappropriate judgment. She does not exhibit a depressed mood. She exhibits abnormal recent memory and abnormal remote memory.      Lab Reviewed:   12/31/11 Lipid panel cholesterol 151, triglycerides 109, HDL 48, LDL 81 04/01/12 CBC wbc 10.4, Hgb 7.1, Hct 22.4, MCH 62.6, MCV 22.4, plt 406   BMP Na 138, K 4.5, glucose 107, Bun 20, creatinine 0.87, Ca 9.5  04/03/12 Na 136, K 3.7, glucose 107, Bun 11, creatinine 0.71, Ca 9.2   Wbc 9.7, Hgb 11.5, Hct 35.1, plt 336 04/12/12  CBC wbc 7.7, Hgb 12.1, Hct 37.8, MCV 74.9, MCH 24.0, plt 290 04/14/12  CBC wbc 5.6, Hgb 11.9, Hct 36.2, plt 275   TSH 3.941 05/07/12 CBC wbc 7.0, Hgb 13.2, Hct 39.2, plt 199    CMP Na 138, K 4.3, glucose 141, Bun 24, creatinine 0.94, Ca 9.9, LFT wnl 05/10/12 UA no growth 05/16/12 CBC wbc 5.5, Hgb 11.6, Hct 35.0, plt 219 05/30/12  CBC wbc 5.2, Hgb 12.6, Hct 37.7, plt 250 06/13/12  CBC wbc 6.3, Hgb 11.6, Hct 36.0, plt 242 06/17/12 Na 138, K 3.8, glucose 115, Bun 17, creatinine 0.86, Ca 10.0, LFT albumin 3.3   Wbc 11.8, Hgb 11.4, Hct 35.1, plt 199 08/04/12  CBC wbc 6.8, Hgb 12.0, Hct 36.9, plt 300   CMP Na 140, K 4.1, glucose 97, Bun 15, creatinine 0.75, Ca 9.3, LFT wnl except total protein 5.8.  09/26/12  CBC wbc 7.8, Hgb 10.5, Hct 32.5, plt 326 10/17/12  CBC wbc 10.8, Hgb 13.1, Hct 40.8, MCV 74.5, MCH 23.9, plt 341 11/10/12   CBC wbc 9.9, Hgb 12.5, Hct 39.7, plt 235     Assessment & Plan:   Marland Kitchen Hypertension controlled  . Pancreatic mass Stable.   . Iron deficiency anemia Improved, continue Fe     . Dementia Progressing, less conversing and slower responses, check TSH   . Chronic airway obstruction, not elsewhere  classified No exacerbation presently  . Unspecified constipation Stable.   . Osteoporosis, unspecified Hx of vertebral fxs  . Pathologic fracture of vertebrae Healed.

## 2012-11-11 DIAGNOSIS — E785 Hyperlipidemia, unspecified: Secondary | ICD-10-CM | POA: Insufficient documentation

## 2012-11-11 DIAGNOSIS — J449 Chronic obstructive pulmonary disease, unspecified: Secondary | ICD-10-CM | POA: Insufficient documentation

## 2012-11-11 DIAGNOSIS — J4489 Other specified chronic obstructive pulmonary disease: Secondary | ICD-10-CM | POA: Insufficient documentation

## 2012-11-11 DIAGNOSIS — M810 Age-related osteoporosis without current pathological fracture: Secondary | ICD-10-CM | POA: Insufficient documentation

## 2012-11-11 DIAGNOSIS — K5900 Constipation, unspecified: Secondary | ICD-10-CM | POA: Insufficient documentation

## 2012-11-11 DIAGNOSIS — M8448XA Pathological fracture, other site, initial encounter for fracture: Secondary | ICD-10-CM | POA: Insufficient documentation

## 2012-11-14 LAB — TSH: TSH: 2.24 u[IU]/mL (ref 0.41–5.90)

## 2012-12-20 ENCOUNTER — Non-Acute Institutional Stay (SKILLED_NURSING_FACILITY): Payer: Medicare Other | Admitting: Nurse Practitioner

## 2012-12-20 DIAGNOSIS — D509 Iron deficiency anemia, unspecified: Secondary | ICD-10-CM

## 2012-12-20 DIAGNOSIS — F0390 Unspecified dementia without behavioral disturbance: Secondary | ICD-10-CM

## 2012-12-20 DIAGNOSIS — K5900 Constipation, unspecified: Secondary | ICD-10-CM

## 2012-12-20 DIAGNOSIS — I10 Essential (primary) hypertension: Secondary | ICD-10-CM

## 2012-12-20 NOTE — Assessment & Plan Note (Signed)
SNF with intensive supervision, takes Namenda 28mg    

## 2012-12-20 NOTE — Assessment & Plan Note (Signed)
Controlled on Metoprolol 25mg bid.    

## 2012-12-20 NOTE — Assessment & Plan Note (Signed)
Managed with MiraLax and Senokot S II daily.    

## 2012-12-20 NOTE — Progress Notes (Signed)
Patient ID: [name] [name], female   DOB: 1914/11/07, 79 y.o.   MRN: [id number]  Chief Complaint:  Chief Complaint  Patient presents with  . Medical Managment of Chronic Issues     HPI:   Problem List Items Addressed This Visit     ICD-9-CM   Iron deficiency anemia     Hx of blood transfusion, currently taking Fe 325mg  daily, last Hgb 12.5 11/10/12    Dementia     SNF with intensive supervision, takes Namenda 28mg      Unspecified constipation - Primary     Managed with MiraLax and Senokot S II daily.     Hypertension (Chronic)     Controlled on Metoprolol 25mg  bid.        Review of Systems:  Review of Systems  Constitutional: Negative for fever, chills, weight loss, malaise/fatigue and diaphoresis.  HENT: Positive for hearing loss. Negative for congestion, sore throat, neck pain and ear discharge.   Eyes: Negative for pain, discharge and redness.  Respiratory: Positive for cough. Negative for sputum production and wheezing.   Cardiovascular: Negative for chest pain, palpitations, orthopnea, claudication, leg swelling and PND.  Gastrointestinal: Negative for heartburn, nausea, vomiting, abdominal pain, diarrhea, constipation and blood in stool.  Genitourinary: Positive for frequency (incontinent of bladder). Negative for dysuria, urgency, hematuria and flank pain.  Musculoskeletal: Positive for joint pain and falls. Negative for myalgias and back pain.  Skin: Negative for itching and rash.       Lots of cutaneous horn and AKs  Neurological: Negative for dizziness, tingling, tremors, sensory change, speech change, focal weakness, seizures, loss of consciousness, weakness and headaches.  Endo/Heme/Allergies: Negative for environmental allergies and polydipsia. Does not bruise/bleed easily.  Psychiatric/Behavioral: Positive for memory loss. Negative for depression. The patient is not nervous/anxious and does not have insomnia.      Medications: Patient's Medications  New  Prescriptions   No medications on file  Previous Medications   ASPIRIN 81 MG CHEWABLE TABLET    Chew 81 mg by mouth daily.   ATORVASTATIN (LIPITOR) 10 MG TABLET    Take 5 mg by mouth daily.   CALCIUM CARBONATE-VITAMIN D (CALCIUM 600 + D PO)    Take 1 tablet by mouth daily.   CHOLECALCIFEROL (VITAMIN D) 1000 UNITS TABLET    Take 1,000 Units by mouth daily.   FERROUS SULFATE (FERROUSUL) 325 (65 FE) MG TABLET    Take 1 tablet (325 mg total) by mouth daily with breakfast.   MEMANTINE (NAMENDA) 10 MG TABLET    Take 10 mg by mouth 2 (two) times daily.   METOPROLOL TARTRATE (LOPRESSOR) 25 MG TABLET    Take 25 mg by mouth 2 (two) times daily. Hold for BP < 90/60 or HR < 50.   POLYETHYLENE GLYCOL (MIRALAX / GLYCOLAX) PACKET    Take 17 g by mouth every other day.   SENNA-DOCUSATE (SENOKOT-S) 8.6-50 MG PER TABLET    Take 2 tablets by mouth at bedtime.   VITAMIN A 40981 UNIT CAPSULE    Take 20,000 Units by mouth daily.  Modified Medications   No medications on file  Discontinued Medications   No medications on file     Physical Exam: Physical Exam  Constitutional: She appears well-developed and well-nourished.  HENT:  Head: Normocephalic and atraumatic.  Eyes: Conjunctivae and EOM are normal. Pupils are equal, round, and reactive to light.  Neck: Normal range of motion. Neck supple. No JVD present. No thyromegaly present.  Cardiovascular: Normal rate,  regular rhythm and normal heart sounds.   No murmur heard. Pulmonary/Chest: Effort normal. She has decreased breath sounds. She has no wheezes. She has no rales.  Abdominal: Soft. Bowel sounds are normal. There is no tenderness.  Musculoskeletal: Normal range of motion. She exhibits no edema and no tenderness.  Lymphadenopathy:    She has no cervical adenopathy.  Neurological: She is alert. She has normal reflexes. No cranial nerve deficit. She exhibits abnormal muscle tone. Coordination normal.  Skin: Skin is warm and dry.  Lots of AK or  cutaneous horns  Psychiatric: Her mood appears not anxious. Her affect is not angry, not blunt, not labile and not inappropriate. Her speech is delayed. Her speech is not slurred. She is slowed. She is not agitated, not aggressive, not hyperactive, not withdrawn, not actively hallucinating and not combative. Thought content is not paranoid and not delusional. Cognition and memory are impaired. She expresses impulsivity and inappropriate judgment. She does not exhibit a depressed mood. She exhibits abnormal recent memory and abnormal remote memory.     Filed Vitals:   12/20/12 1402  BP: 134/82  Pulse: 98  Temp: 97.2 F (36.2 C)  TempSrc: Tympanic  Resp: 16      Labs reviewed: Basic Metabolic Panel:  Recent Labs  16/10/96 1130 04/03/12 0510 06/17/12 0345 06/17/12 0401 08/04/12 11/14/12  NA 135 136 138 141 140  --   K 4.0 3.7 3.8 3.8 4.1  --   CL 101 102 100 101  --   --   CO2 27 25 30   --   --   --   GLUCOSE 107* 107* 115* 113*  --   --   BUN 15 11 17 17 15   --   CREATININE 0.77 0.71 0.86 1.00 0.8  --   CALCIUM 9.1 9.2 10.0  --   --   --   TSH  --   --   --   --   --  2.24    Liver Function Tests:  Recent Labs  06/17/12 0345  AST 19  ALT 10  ALKPHOS 80  BILITOT 0.5  PROT 6.2  ALBUMIN 3.3*    CBC:  Recent Labs  04/01/12 1757 04/02/12 1130 04/03/12 0510 06/17/12 0345 06/17/12 0401 11/10/12  WBC 7.6 8.5 9.7 11.8*  --  9.9  NEUTROABS 4.5  --   --   --   --   --   HGB 6.5* 10.1* 11.5* 11.4* 12.2 12.5  HCT 21.2* 31.2* 35.1* 35.1* 36.0 40  MCV 65.4* 69.6* 69.9* 81.6  --   --   PLT 378 304 336 199  --  235    Anemia Panel:  Recent Labs  04/02/12 0015  IRON 11*  FOLATE 10.6  VITAMINB12 673    Significant Diagnostic Results:     Assessment/Plan Unspecified constipation Managed with MiraLax and Senokot S II daily.   Iron deficiency anemia Hx of blood transfusion, currently taking Fe 325mg  daily, last Hgb 12.5 11/10/12  Dementia SNF with  intensive supervision, takes Namenda 28mg    Hypertension Controlled on Metoprolol 25mg  bid.       Family/ staff Communication: supervision for safety.    Goals of care: SNF  Labs/tests ordered none

## 2012-12-20 NOTE — Assessment & Plan Note (Signed)
Hx of blood transfusion, currently taking Fe 325mg daily, last Hgb 12.5 11/10/12   

## 2013-01-06 ENCOUNTER — Non-Acute Institutional Stay (SKILLED_NURSING_FACILITY): Payer: Medicare Other | Admitting: Nurse Practitioner

## 2013-01-06 DIAGNOSIS — R000 Tachycardia, unspecified: Secondary | ICD-10-CM

## 2013-01-06 DIAGNOSIS — I10 Essential (primary) hypertension: Secondary | ICD-10-CM

## 2013-01-06 DIAGNOSIS — K5900 Constipation, unspecified: Secondary | ICD-10-CM

## 2013-01-06 DIAGNOSIS — D509 Iron deficiency anemia, unspecified: Secondary | ICD-10-CM

## 2013-01-06 NOTE — Assessment & Plan Note (Signed)
SNF with intensive supervision, takes Namenda 28mg 

## 2013-01-06 NOTE — Progress Notes (Signed)
Patient ID: [name] [name], female   DOB: 18-Nov-1914, 79 y.o.   MRN: [id number]  Chief Complaint:  Chief Complaint  Patient presents with  . Medical Managment of Chronic Issues    elevated Bps.      HPI:   Problem List Items Addressed This Visit   Hypertension - Primary (Chronic)     Not ontrolled on Metoprolol 25mg  bid--Bp 166/98, 170/98, 140/90--will increase Metoprolol to 50mg  bid, monitor Bp      Tachycardia (Chronic)     EKG to evaluate further, increase Metoprolol to 50mg  bid, monitor HR. HR 80-114     Iron deficiency anemia     Hx of blood transfusion, currently taking Fe 325mg  daily, last Hgb 12.5 11/10/12      Unspecified constipation     Managed with MiraLax and Senokot S II daily.          Review of Systems:  Review of Systems  Constitutional: Negative for fever, chills, weight loss, malaise/fatigue and diaphoresis.  HENT: Positive for hearing loss. Negative for congestion, sore throat, neck pain and ear discharge.   Eyes: Negative for pain, discharge and redness.  Respiratory: Positive for cough. Negative for sputum production and wheezing.   Cardiovascular: Negative for chest pain, palpitations, orthopnea, claudication, leg swelling and PND.  Gastrointestinal: Negative for heartburn, nausea, vomiting, abdominal pain, diarrhea, constipation and blood in stool.  Genitourinary: Positive for frequency (incontinent of bladder). Negative for dysuria, urgency, hematuria and flank pain.  Musculoskeletal: Positive for joint pain and falls. Negative for myalgias and back pain.  Skin: Negative for itching and rash.       Lots of cutaneous horn and AKs  Neurological: Negative for dizziness, tingling, tremors, sensory change, speech change, focal weakness, seizures, loss of consciousness, weakness and headaches.  Endo/Heme/Allergies: Negative for environmental allergies and polydipsia. Does not bruise/bleed easily.  Psychiatric/Behavioral: Positive for memory loss. Negative  for depression. The patient is not nervous/anxious and does not have insomnia.     Medications: Patient's Medications  New Prescriptions   No medications on file  Previous Medications   ASPIRIN 81 MG CHEWABLE TABLET    Chew 81 mg by mouth daily.   ATORVASTATIN (LIPITOR) 10 MG TABLET    Take 5 mg by mouth daily.   CALCIUM CARBONATE-VITAMIN D (CALCIUM 600 + D PO)    Take 1 tablet by mouth daily.   CHOLECALCIFEROL (VITAMIN D) 1000 UNITS TABLET    Take 1,000 Units by mouth daily.   FERROUS SULFATE (FERROUSUL) 325 (65 FE) MG TABLET    Take 1 tablet (325 mg total) by mouth daily with breakfast.   MEMANTINE (NAMENDA) 10 MG TABLET    Take 10 mg by mouth 2 (two) times daily.   METOPROLOL TARTRATE (LOPRESSOR) 25 MG TABLET    Take 25 mg by mouth 2 (two) times daily. Hold for BP < 90/60 or HR < 50.   POLYETHYLENE GLYCOL (MIRALAX / GLYCOLAX) PACKET    Take 17 g by mouth every other day.   SENNA-DOCUSATE (SENOKOT-S) 8.6-50 MG PER TABLET    Take 2 tablets by mouth at bedtime.   VITAMIN A 78295 UNIT CAPSULE    Take 20,000 Units by mouth daily.  Modified Medications   No medications on file  Discontinued Medications   No medications on file     Physical Exam: Physical Exam  Constitutional: She appears well-developed and well-nourished.  HENT:  Head: Normocephalic and atraumatic.  Eyes: Conjunctivae and EOM are normal. Pupils are equal,  round, and reactive to light.  Neck: Normal range of motion. Neck supple. No JVD present. No thyromegaly present.  Cardiovascular: Normal rate, regular rhythm and normal heart sounds.   No murmur heard. Pulmonary/Chest: Effort normal. She has decreased breath sounds. She has no wheezes. She has no rales.  Abdominal: Soft. Bowel sounds are normal. There is no tenderness.  Musculoskeletal: Normal range of motion. She exhibits no edema and no tenderness.  Lymphadenopathy:    She has no cervical adenopathy.  Neurological: She is alert. She has normal reflexes. No  cranial nerve deficit. She exhibits abnormal muscle tone. Coordination normal.  Skin: Skin is warm and dry.  Lots of AK or cutaneous horns  Psychiatric: Her mood appears not anxious. Her affect is not angry, not blunt, not labile and not inappropriate. Her speech is delayed. Her speech is not slurred. She is slowed. She is not agitated, not aggressive, not hyperactive, not withdrawn, not actively hallucinating and not combative. Thought content is not paranoid and not delusional. Cognition and memory are impaired. She expresses impulsivity and inappropriate judgment. She does not exhibit a depressed mood. She exhibits abnormal recent memory and abnormal remote memory.     Filed Vitals:   01/06/13 1126  BP: 170/98  Pulse: 114  Temp: 98.2 F (36.8 C)  TempSrc: Tympanic  Resp: 16      Labs reviewed: Basic Metabolic Panel:  Recent Labs  16/10/96 1130 04/03/12 0510 06/17/12 0345 06/17/12 0401 08/04/12 11/14/12  NA 135 136 138 141 140  --   K 4.0 3.7 3.8 3.8 4.1  --   CL 101 102 100 101  --   --   CO2 27 25 30   --   --   --   GLUCOSE 107* 107* 115* 113*  --   --   BUN 15 11 17 17 15   --   CREATININE 0.77 0.71 0.86 1.00 0.8  --   CALCIUM 9.1 9.2 10.0  --   --   --   TSH  --   --   --   --   --  2.24    Liver Function Tests:  Recent Labs  06/17/12 0345  AST 19  ALT 10  ALKPHOS 80  BILITOT 0.5  PROT 6.2  ALBUMIN 3.3*    CBC:  Recent Labs  04/01/12 1757 04/02/12 1130 04/03/12 0510 06/17/12 0345 06/17/12 0401 11/10/12  WBC 7.6 8.5 9.7 11.8*  --  9.9  NEUTROABS 4.5  --   --   --   --   --   HGB 6.5* 10.1* 11.5* 11.4* 12.2 12.5  HCT 21.2* 31.2* 35.1* 35.1* 36.0 40  MCV 65.4* 69.6* 69.9* 81.6  --   --   PLT 378 304 336 199  --  235    Anemia Panel:  Recent Labs  04/02/12 0015  IRON 11*  FOLATE 10.6  VITAMINB12 673    Significant Diagnostic Results:     Assessment/Plan Hypertension Not ontrolled on Metoprolol 25mg  bid--Bp 166/98, 170/98,  140/90--will increase Metoprolol to 50mg  bid, monitor Bp    Tachycardia EKG to evaluate further, increase Metoprolol to 50mg  bid, monitor HR. HR 80-114   Iron deficiency anemia Hx of blood transfusion, currently taking Fe 325mg  daily, last Hgb 12.5 11/10/12    Dementia SNF with intensive supervision, takes Namenda 28mg      Unspecified constipation Managed with MiraLax and Senokot S II daily.        Family/ staff Communication: monitor Bp and  HR   Goals of care: SNF   Labs/tests ordered none.

## 2013-01-06 NOTE — Assessment & Plan Note (Signed)
Not ontrolled on Metoprolol 25mg  bid--Bp 166/98, 170/98, 140/90--will increase Metoprolol to 50mg  bid, monitor Bp

## 2013-01-06 NOTE — Assessment & Plan Note (Signed)
EKG to evaluate further, increase Metoprolol to 50mg  bid, monitor HR. HR 80-114

## 2013-01-06 NOTE — Assessment & Plan Note (Signed)
Managed with MiraLax and Senokot S II daily.

## 2013-01-06 NOTE — Assessment & Plan Note (Signed)
Hx of blood transfusion, currently taking Fe 325mg  daily, last Hgb 12.5 11/10/12

## 2013-01-07 ENCOUNTER — Encounter (HOSPITAL_COMMUNITY): Payer: Self-pay | Admitting: Cardiology

## 2013-01-07 ENCOUNTER — Emergency Department (HOSPITAL_COMMUNITY): Payer: Medicare Other

## 2013-01-07 ENCOUNTER — Emergency Department (HOSPITAL_COMMUNITY)
Admission: EM | Admit: 2013-01-07 | Discharge: 2013-01-07 | Disposition: A | Discharge status: Skilled Nursing Facility | Payer: Medicare Other | Attending: Emergency Medicine | Admitting: Emergency Medicine

## 2013-01-07 DIAGNOSIS — I4891 Unspecified atrial fibrillation: Secondary | ICD-10-CM | POA: Insufficient documentation

## 2013-01-07 DIAGNOSIS — I10 Essential (primary) hypertension: Secondary | ICD-10-CM | POA: Insufficient documentation

## 2013-01-07 DIAGNOSIS — Z8542 Personal history of malignant neoplasm of other parts of uterus: Secondary | ICD-10-CM | POA: Insufficient documentation

## 2013-01-07 DIAGNOSIS — E785 Hyperlipidemia, unspecified: Secondary | ICD-10-CM | POA: Insufficient documentation

## 2013-01-07 DIAGNOSIS — J90 Pleural effusion, not elsewhere classified: Secondary | ICD-10-CM | POA: Insufficient documentation

## 2013-01-07 DIAGNOSIS — Z85828 Personal history of other malignant neoplasm of skin: Secondary | ICD-10-CM | POA: Insufficient documentation

## 2013-01-07 DIAGNOSIS — I509 Heart failure, unspecified: Secondary | ICD-10-CM | POA: Insufficient documentation

## 2013-01-07 DIAGNOSIS — M899 Disorder of bone, unspecified: Secondary | ICD-10-CM | POA: Insufficient documentation

## 2013-01-07 DIAGNOSIS — Z79899 Other long term (current) drug therapy: Secondary | ICD-10-CM | POA: Insufficient documentation

## 2013-01-07 DIAGNOSIS — K869 Disease of pancreas, unspecified: Secondary | ICD-10-CM | POA: Insufficient documentation

## 2013-01-07 DIAGNOSIS — I5021 Acute systolic (congestive) heart failure: Secondary | ICD-10-CM | POA: Insufficient documentation

## 2013-01-07 DIAGNOSIS — F0390 Unspecified dementia without behavioral disturbance: Secondary | ICD-10-CM | POA: Insufficient documentation

## 2013-01-07 DIAGNOSIS — Z87312 Personal history of (healed) stress fracture: Secondary | ICD-10-CM | POA: Insufficient documentation

## 2013-01-07 LAB — COMPREHENSIVE METABOLIC PANEL
ALT: 11 U/L (ref 0–35)
AST: 24 U/L (ref 0–37)
Albumin: 3.2 g/dL — ABNORMAL LOW (ref 3.5–5.2)
Calcium: 9.6 mg/dL (ref 8.4–10.5)
Creatinine, Ser: 0.92 mg/dL (ref 0.50–1.10)
Sodium: 143 mEq/L (ref 135–145)

## 2013-01-07 LAB — CBC WITH DIFFERENTIAL/PLATELET
Basophils Absolute: 0.1 10*3/uL (ref 0.0–0.1)
Basophils Relative: 1 % (ref 0–1)
Eosinophils Relative: 0 % (ref 0–5)
HCT: 40.1 % (ref 36.0–46.0)
Lymphocytes Relative: 9 % — ABNORMAL LOW (ref 12–46)
MCHC: 33.7 g/dL (ref 30.0–36.0)
MCV: 83.4 fL (ref 78.0–100.0)
Monocytes Absolute: 0.7 10*3/uL (ref 0.1–1.0)
Neutro Abs: 9.2 10*3/uL — ABNORMAL HIGH (ref 1.7–7.7)
Platelets: 200 10*3/uL (ref 150–400)
RDW: 19 % — ABNORMAL HIGH (ref 11.5–15.5)
WBC: 11 10*3/uL — ABNORMAL HIGH (ref 4.0–10.5)

## 2013-01-07 LAB — APTT: aPTT: 31 seconds (ref 24–37)

## 2013-01-07 LAB — POCT I-STAT TROPONIN I: Troponin i, poc: 0.01 ng/mL (ref 0.00–0.08)

## 2013-01-07 LAB — PROTIME-INR: INR: 1.07 (ref 0.00–1.49)

## 2013-01-07 LAB — PRO B NATRIURETIC PEPTIDE: Pro B Natriuretic peptide (BNP): 3186 pg/mL — ABNORMAL HIGH (ref 0–450)

## 2013-01-07 LAB — D-DIMER, QUANTITATIVE: D-Dimer, Quant: 1.44 ug/mL-FEU — ABNORMAL HIGH (ref 0.00–0.48)

## 2013-01-07 MED ORDER — METOPROLOL TARTRATE 25 MG PO TABS
50.0000 mg | ORAL_TABLET | Freq: Once | ORAL | Status: AC
  Administered 2013-01-07: 50 mg via ORAL
  Filled 2013-01-07: qty 2

## 2013-01-07 MED ORDER — FUROSEMIDE 20 MG PO TABS
40.0000 mg | ORAL_TABLET | Freq: Once | ORAL | Status: AC
  Administered 2013-01-07: 40 mg via ORAL
  Filled 2013-01-07: qty 2

## 2013-01-07 MED ORDER — FUROSEMIDE 40 MG PO TABS: 40.0000 mg | ORAL_TABLET | Freq: Every day | ORAL | Status: DC

## 2013-01-07 NOTE — ED Provider Notes (Signed)
History     CSN: [id number]  Arrival date & time 01/07/13  0758   First MD Initiated Contact with Patient 01/07/13 [phone]      Chief Complaint  Patient presents with  . Shortness of Breath  . Hypertension    (Consider location/radiation/quality/duration/timing/severity/associated sxs/prior treatment) HPI Comments: EMS reports that when nursing home staff did vitals this morning patient was satting in the 80s which improved on 4 L of oxygen and she was hypertensive this morning which is unusual for this patient. She was given 325 of aspirin and sent to the hospital for further evaluation. EMS noted a cough in route but it was not reported that she had a cough the patient denies a cough, shortness of breath, fever or any other complaints. Nursing home did not report fever or infectious symptoms.  Patient is a 79 y.o. female presenting with hypertension. The history is provided by the EMS personnel. The history is limited by the condition of the patient (hx of dementia she voices no complaints).  Hypertension Pertinent negatives include no shortness of breath.    Past Medical History  Diagnosis Date  . Hypertension   . Hyperlipidemia   . Cancer     uterine  . Skin cancer     basal, squamous cell - radiation  . Compression fx, thoracic spine 01/22/2008    fx T9 - on bedrest x 6 months  . Osteopenia   . Dementia   . Pancreatic mass approx. 2009     Past Surgical History  Procedure Laterality Date  . Abdominal hysterectomy      for ca    No family history on file.  History  Substance Use Topics  . Smoking status: Never Smoker   . Smokeless tobacco: Never Used  . Alcohol Use: No    OB History   Grav Para Term Preterm Abortions TAB SAB Ect Mult Living                  Review of Systems  Unable to perform ROS Constitutional: Negative for fever.  Respiratory: Positive for cough. Negative for shortness of breath.     Allergies  Actonel; Fosamax; Lortab; Masoprocol;  Morphine and related; Niacin and related; Phenergan; Quinidine; and Quinine derivatives  Home Medications   Current Outpatient Rx  Name  Route  Sig  Dispense  Refill  . aspirin 81 MG chewable tablet   Oral   Chew 81 mg by mouth daily.         Marland Kitchen atorvastatin (LIPITOR) 10 MG tablet   Oral   Take 5 mg by mouth daily.         . Calcium Carbonate-Vitamin D (CALCIUM 600 + D PO)   Oral   Take 1 tablet by mouth daily.         . cholecalciferol (VITAMIN D) 1000 UNITS tablet   Oral   Take 1,000 Units by mouth daily.         . ferrous sulfate (FERROUSUL) 325 (65 FE) MG tablet   Oral   Take 1 tablet (325 mg total) by mouth daily with breakfast.         . metoprolol tartrate (LOPRESSOR) 25 MG tablet   Oral   Take 25 mg by mouth 2 (two) times daily. Hold for BP < 90/60 or HR < 50.         . polyethylene glycol (MIRALAX / GLYCOLAX) packet   Oral   Take 17 g by mouth every other  day.         . senna-docusate (SENOKOT-S) 8.6-50 MG per tablet   Oral   Take 2 tablets by mouth at bedtime.         . vitamin A 16109 UNIT capsule   Oral   Take 20,000 Units by mouth daily.           BP 161/111  Temp(Src) 99.1 F (37.3 C) (Rectal)  Resp 24  Ht 5\' 4"  (1.626 m)  Wt 120 lb 4 oz (54.545 kg)  BMI 20.63 kg/m2  SpO2 96%  Physical Exam  Nursing note and vitals reviewed. Constitutional: She appears well-developed and well-nourished. No distress.  HENT:  Head: Normocephalic and atraumatic.  Mouth/Throat: Oropharynx is clear and moist.  Eyes: Conjunctivae and EOM are normal. Pupils are equal, round, and reactive to light.  Neck: Normal range of motion. Neck supple.  Cardiovascular: Intact distal pulses.  An irregularly irregular rhythm present. Tachycardia present.   No murmur heard. Pulmonary/Chest: Effort normal. No respiratory distress. She has no wheezes. She has rales in the right lower field and the left lower field.  Abdominal: Soft. She exhibits no distension.  There is no tenderness. There is no rebound and no guarding.  Musculoskeletal: Normal range of motion. She exhibits no edema and no tenderness.  Small skin tear on the left lower leg  Neurological: She is alert.  Oriented to person  Skin: Skin is warm and dry.  Dry scaly patches over the hands and legs with mild erythema  Psychiatric: She has a normal mood and affect. Her behavior is normal.    ED Course  Procedures (including critical care time)  Labs Reviewed  CBC WITH DIFFERENTIAL - Abnormal; Notable for the following:    WBC 11.0 (*)    RDW 19.0 (*)    Neutrophils Relative % 84 (*)    Neutro Abs 9.2 (*)    Lymphocytes Relative 9 (*)    All other components within normal limits  COMPREHENSIVE METABOLIC PANEL - Abnormal; Notable for the following:    Glucose, Bld 109 (*)    Albumin 3.2 (*)    GFR calc non Af Amer 51 (*)    GFR calc Af Amer 59 (*)    All other components within normal limits  PRO B NATRIURETIC PEPTIDE - Abnormal; Notable for the following:    Pro B Natriuretic peptide (BNP) 3186.0 (*)    All other components within normal limits  PROTIME-INR  APTT  D-DIMER, QUANTITATIVE  POCT I-STAT TROPONIN I   Dg Chest 2 View  01/07/2013   *RADIOLOGY REPORT*  Clinical Data: Cough and low oxygen saturation  CHEST - 2 VIEW  Comparison: 06/17/2012  Findings: There are moderate to large right and moderate left pleural effusions obscuring hemidiaphragms. There is associated dependent lung opacity, most likely compressive atelectasis. Central vascular congestion is noted.  No pneumothorax.  The cardiac silhouette is normal in overall size.  No mediastinal or hilar masses.  IMPRESSION: Increased pleural effusions, moderate to large on the right and moderate on the left, with central vascular congestion.  There is dependent lung opacity adjacent to the pleural effusions that is likely compressive atelectasis.  Infiltrate is possible.   Original Report Authenticated By: Amie Portland,  M.D.     Date: 01/07/2013  Rate: 114  Rhythm: atrial fibrillation  QRS Axis: right  Intervals: normal  ST/T Wave abnormalities: nonspecific T wave changes  Conduction Disutrbances:none  Narrative Interpretation:   Old EKG Reviewed: changes noted  No diagnosis found.    MDM   Patient is a 79 year old to a center today from the nursing home because she was hypoxic today on room air satting 87-89% which is new for her as well as hypertensive but unclear she received her antihypertensive medications this morning. She does take Lopressor.  Patient has a history of dementia, atrial fibrillation, hypertension who has no complaints today. However patient has multiple risk factors for PE including cancer, pancreatic mass, A. fib not on anticoagulation vs CHF from uncontrolled a.fib.  Patient currently has no complaints at this time. And she is in no acute distress. On 2 L of oxygen she satting between 89 and 95% she is awake and alert but is tachycardic on exam.  CBC, CMP, INR, PTT, BNP, d-dimer, chest x-ray pending.  9:59 AM Patient found today to be in CHF with bilateral pleural effusions, vascular congestion and a BNP that is increased to 3000 from 117. This explains the patient's hypoxia and shortness of breath. She is currently not on Lasix. She has a normal creatinine and will start her on Lasix and discharged back to the nursing facility and she is in no acute distress and does have oxygen when necessary.  10:12 AM D-dimer is elevated however feel that we have a source for the patient's shortness of breath and hypoxia with the CHF and pleural effusions. Do not feel she needs further imaging with a CT angio.   Based on notes from yesterday she saw the facility physician who increased her metoprolol to 50 mg twice a day to help control her hypertension and her rate of atrial fibrillation.  We'll have her see facility physician on Monday for recheck and we'll start Lasix 40 mg  daily.  Gwyneth Sprout, MD 01/07/13 1013

## 2013-01-07 NOTE — ED Notes (Addendum)
Per EMS pt was checked on at the nursing home ([facility]) and the nurse noticed her 02 sats were low and she had high blood pressure. EMS arrived and put the patients on 4L 02 and the pt 02 sats were 95%. Pt denies pain. Per EMS pt has a hx of dementia and the nursing home states the pt is at her baseline. Per EMS pt t is not ambulatory and uses a wheelchair to get around.

## 2013-01-07 NOTE — ED Notes (Signed)
Metroprolol mixed in applesauce.

## 2013-01-07 NOTE — ED Notes (Addendum)
Tegaderm and bacitracin applied to pt's skin tear on her left lower leg. Dressing clean, dry, and intact.

## 2013-01-09 LAB — BASIC METABOLIC PANEL
Potassium: 3.4 mmol/L (ref 3.4–5.3)
Sodium: 147 mmol/L (ref 137–147)

## 2013-01-10 ENCOUNTER — Non-Acute Institutional Stay (SKILLED_NURSING_FACILITY): Payer: Medicare Other | Admitting: Nurse Practitioner

## 2013-01-10 DIAGNOSIS — I10 Essential (primary) hypertension: Secondary | ICD-10-CM

## 2013-01-10 DIAGNOSIS — R627 Adult failure to thrive: Secondary | ICD-10-CM

## 2013-01-10 DIAGNOSIS — I509 Heart failure, unspecified: Secondary | ICD-10-CM | POA: Insufficient documentation

## 2013-01-10 DIAGNOSIS — E870 Hyperosmolality and hypernatremia: Secondary | ICD-10-CM

## 2013-01-10 DIAGNOSIS — I4891 Unspecified atrial fibrillation: Secondary | ICD-10-CM

## 2013-01-10 DIAGNOSIS — D509 Iron deficiency anemia, unspecified: Secondary | ICD-10-CM

## 2013-01-10 DIAGNOSIS — E876 Hypokalemia: Secondary | ICD-10-CM | POA: Insufficient documentation

## 2013-01-10 NOTE — Assessment & Plan Note (Signed)
SNF with intensive supervision, takes Namenda 28mg , declining rapidly in the past 10 months.

## 2013-01-10 NOTE — Assessment & Plan Note (Signed)
Will have Furosemide reduced to 20mg (from 40mg  started 01/07/13 in ER) due to Na 147, K 3.4--f/u BMP/BNP

## 2013-01-10 NOTE — Assessment & Plan Note (Signed)
Mild, serum Na 147 01/09/13--decrease Lasix from 40mg  to 20mg --update BMp

## 2013-01-10 NOTE — Assessment & Plan Note (Signed)
Weight loss, cognitive decline, unresponsive episode 12am to 1.20 am 01/09/13-mottling of arms and hands--POA aware of the event and came in to visit the patient--MOST feeding tube for a defined trial period with POA's approval. Supportive measures for now

## 2013-01-10 NOTE — Assessment & Plan Note (Signed)
Update CBC. 

## 2013-01-10 NOTE — Assessment & Plan Note (Signed)
Better controlled on Lasix and Metoprolol.

## 2013-01-10 NOTE — Assessment & Plan Note (Signed)
Mild, add Kcl daily, reduce Lasix from 40mg  to 20mg , update BMP

## 2013-01-10 NOTE — Progress Notes (Signed)
Patient ID: [name] [name], female   DOB: 1915/04/21, 79 y.o.   MRN: [id number] Code Status: DNR  Allergies  Allergen Reactions  . Actonel (Risedronate Sodium)     Noted on MAR.  Marland Kitchen Fosamax (Alendronate Sodium)     Noted on MAR.  Marland Kitchen Lortab (Hydrocodone-Acetaminophen)     Noted on MAR.  . Masoprocol     Noted on MAR.  Marland Kitchen Morphine And Related     Noted on MAR.  Marland Kitchen Niacin And Related     Noted on MAR.  Marland Kitchen Phenergan (Promethazine Hcl)     Noted on MAR.  . Quinidine     Noted on MAR.  . Quinine Derivatives     Noted on MAR.    Chief Complaint  Patient presents with  . Medical Managment of Chronic Issues    FTT, Afib, CHF, hypernatremia, hypokalemia.   Marland Kitchen Hospitalization Follow-up    HPI: Patient is a 79 y.o. female seen in the SNF at [facility] today for f/u ER evaluation 01/07/13 for new onset Afib, CHF, HTN. The patient HR is 100s, able to maintain O2 sat >89% on O2 2lpm via [place] on Metoprolol 50mg  bid since 01/06/13 and Furosemide 40mg  01/07/13. On 01/09/13 Na 147, K 3.4, and Bun/creat elevated from her baseline. The patient continues to be lethargic, had an episode of unresponsiveness 12am to 1:20am 01/09/13--POA came in to visit the patient. The patient has been declined mental and physically since last hospitalization for GI bleed and acute blood loss anemia 04/01/12-04/03/12.  Problem List Items Addressed This Visit   Hypertension (Chronic)     Better controlled on Lasix and Metoprolol.     A-fib - Primary (Chronic)     New onset, rate controlled on Metoprolol 50mg  bid since 01/06/13 for elevated blood pressure. EKG 01/06/13 vent rate 102, Afib. ER evaluation: A-fib and CHF,  01/07/13:  EKG 01/07/13 vent rate 105, Afib. Furosemide 40mg  daily started, Metoprolol 50mg  x1 given while in ER, O2 @ 2lpm via [place] to maintain O2 sat >89%. BMP 01/09/13: Na 147, K 3.4, Bun 17, creat 0.97. Will decrease Lasix to 20mg  daily along with Kcl , f/u BMP and BNP     Hypernatremia     Mild, serum Na 147  01/09/13--decrease Lasix from 40mg  to 20mg --update BMp    Hypokalemia     Mild, add Kcl daily, reduce Lasix from 40mg  to 20mg , update BMP    FTT (failure to thrive) in adult     Weight loss, cognitive decline, unresponsive episode 12am to 1.20 am 01/09/13-mottling of arms and hands--POA aware of the event and came in to visit the patient--MOST feeding tube for a defined trial period with POA's approval. Supportive measures for now       Review of Systems: Review of Systems  Constitutional: Positive for weight loss and malaise/fatigue. Negative for fever, chills and diaphoresis.  HENT: Positive for hearing loss. Negative for congestion, sore throat, neck pain and ear discharge.   Eyes: Negative for pain, discharge and redness.  Respiratory: Positive for cough. Negative for sputum production and wheezing.   Cardiovascular: Negative for chest pain, palpitations, orthopnea, claudication, leg swelling and PND.  Gastrointestinal: Negative for heartburn, nausea, vomiting, abdominal pain, diarrhea, constipation and blood in stool.  Genitourinary: Positive for frequency (incontinent of bladder). Negative for dysuria, urgency, hematuria and flank pain.  Musculoskeletal: Positive for joint pain and falls. Negative for myalgias and back pain.  Skin: Negative for itching and rash.  Lots of cutaneous horn and AKs  Neurological: Positive for weakness. Negative for dizziness, tingling, tremors, sensory change, speech change, focal weakness, seizures, loss of consciousness and headaches.  Endo/Heme/Allergies: Negative for environmental allergies and polydipsia. Does not bruise/bleed easily.  Psychiatric/Behavioral: Positive for memory loss. Negative for depression. The patient is not nervous/anxious and does not have insomnia.      Past Medical History  Diagnosis Date  . Hypertension   . Hyperlipidemia   . Cancer     uterine  . Skin cancer     basal, squamous cell - radiation  . Compression  fx, thoracic spine 01/22/2008    fx T9 - on bedrest x 6 months  . Osteopenia   . Dementia   . Pancreatic mass approx. 2009    Past Surgical History  Procedure Laterality Date  . Abdominal hysterectomy      for ca   Social History:   reports that she has never smoked. She has never used smokeless tobacco. She reports that she does not drink alcohol or use illicit drugs.  No family history on file.  Medications: Patient's Medications  New Prescriptions   No medications on file  Previous Medications   ASPIRIN 81 MG CHEWABLE TABLET    Chew 81 mg by mouth daily.   CALCIUM CARBONATE-VITAMIN D (CALCIUM 600 + D PO)    Take 1 tablet by mouth daily.   CHOLECALCIFEROL (VITAMIN D) 1000 UNITS TABLET    Take 1,000 Units by mouth daily.   FERROUS SULFATE (FERROUSUL) 325 (65 FE) MG TABLET    Take 1 tablet (325 mg total) by mouth daily with breakfast.   FUROSEMIDE (LASIX) 40 MG TABLET    Take 1 tablet (40 mg total) by mouth daily. Start 01/08/13   MEMANTINE HCL ER (NAMENDA XR) 28 MG CP24    Take 28 mg by mouth daily.   METOPROLOL TARTRATE (LOPRESSOR) 25 MG TABLET    Take 25 mg by mouth 2 (two) times daily. Hold for BP < 90/60 or HR < 50.   NUTRITIONAL SUPPLEMENTS (BOOST PUDDING PO)    Take 1 Package by mouth 2 (two) times daily.   POLYETHYLENE GLYCOL (MIRALAX / GLYCOLAX) PACKET    Take 17 g by mouth every other day.   SENNA-DOCUSATE (SENOKOT-S) 8.6-50 MG PER TABLET    Take 2 tablets by mouth at bedtime.  Modified Medications   No medications on file  Discontinued Medications   No medications on file     Physical Exam: Physical Exam  Constitutional: She appears well-developed and well-nourished.  HENT:  Head: Normocephalic and atraumatic.  Eyes: Conjunctivae and EOM are normal. Pupils are equal, round, and reactive to light.  Neck: Normal range of motion. Neck supple. No JVD present. No thyromegaly present.  Cardiovascular: Normal rate and normal heart sounds.  An irregular rhythm present.   No murmur heard. Pulmonary/Chest: Effort normal. She has decreased breath sounds. She has no wheezes. She has no rales.  Abdominal: Soft. Bowel sounds are normal. There is no tenderness.  Musculoskeletal: Normal range of motion. She exhibits no edema and no tenderness.  Lymphadenopathy:    She has no cervical adenopathy.  Neurological: She is alert. She has normal reflexes. No cranial nerve deficit. She exhibits abnormal muscle tone. Coordination normal.  Skin: Skin is warm and dry.  Lots of AK or cutaneous horns  Psychiatric: Her mood appears not anxious. Her affect is not angry, not blunt, not labile and not inappropriate. Her speech is delayed.  Her speech is not slurred. She is slowed. She is not agitated, not aggressive, not hyperactive, not withdrawn, not actively hallucinating and not combative. Thought content is not paranoid and not delusional. Cognition and memory are impaired. She expresses impulsivity and inappropriate judgment. She does not exhibit a depressed mood. She exhibits abnormal recent memory and abnormal remote memory.    Filed Vitals:   01/10/13 1239  BP: 138/80  Pulse: 94  Temp: 97.2 F (36.2 C)  TempSrc: Tympanic  Resp: 16  SpO2: 95%      Labs reviewed: Basic Metabolic Panel:  Recent Labs  16/10/96 0510 06/17/12 0345 06/17/12 0401 08/04/12 11/14/12 01/07/13 0900 01/09/13  NA 136 138 141 140  --  143 147  K 3.7 3.8 3.8 4.1  --  4.1 3.4  CL 102 100 101  --   --  105  --   CO2 25 30  --   --   --  28  --   GLUCOSE 107* 115* 113*  --   --  109*  --   BUN 11 17 17 15   --  14 17  CREATININE 0.71 0.86 1.00 0.8  --  0.92 1.0  CALCIUM 9.2 10.0  --   --   --  9.6  --   TSH  --   --   --   --  2.24  --   --    Liver Function Tests:  Recent Labs  06/17/12 0345 01/07/13 0900  AST 19 24  ALT 10 11  ALKPHOS 80 84  BILITOT 0.5 0.4  PROT 6.2 6.6  ALBUMIN 3.3* 3.2*   No results found for this basename: LIPASE, AMYLASE,  in the last 8760 hours No  results found for this basename: AMMONIA,  in the last 8760 hours CBC:  Recent Labs  04/01/12 1757  04/03/12 0510 06/17/12 0345 06/17/12 0401 11/10/12 01/07/13 0900  WBC 7.6  < > 9.7 11.8*  --  9.9 11.0*  NEUTROABS 4.5  --   --   --   --   --  9.2*  HGB 6.5*  < > 11.5* 11.4* 12.2 12.5 13.5  HCT 21.2*  < > 35.1* 35.1* 36.0 40 40.1  MCV 65.4*  < > 69.9* 81.6  --   --  83.4  PLT 378  < > 336 199  --  235 200  < > = values in this interval not displayed. Lipid Panel: No results found for this basename: CHOL, HDL, LDLCALC, TRIG, CHOLHDL, LDLDIRECT,  in the last 8760 hours Anemia Panel:  Recent Labs  04/02/12 0015  FOLATE 10.6  IRON 11*  VITAMINB12 673    Past Procedures:     Assessment/Plan A-fib New onset, rate controlled on Metoprolol 50mg  bid since 01/06/13 for elevated blood pressure. EKG 01/06/13 vent rate 102, Afib. ER evaluation: A-fib and CHF,  01/07/13:  EKG 01/07/13 vent rate 105, Afib. Furosemide 40mg  daily started, Metoprolol 50mg  x1 given while in ER, O2 @ 2lpm via [place] to maintain O2 sat >89%. BMP 01/09/13: Na 147, K 3.4, Bun 17, creat 0.97. Will decrease Lasix to 20mg  daily along with Kcl , f/u BMP and BNP   Hypertension Better controlled on Lasix and Metoprolol.   CHF (congestive heart failure) Will have Furosemide reduced to 20mg (from 40mg  started 01/07/13 in ER) due to Na 147, K 3.4--f/u BMP/BNP  Hypernatremia Mild, serum Na 147 01/09/13--decrease Lasix from 40mg  to 20mg --update BMp  Hypokalemia Mild, add Kcl daily, reduce  Lasix from 40mg  to 20mg , update BMP  FTT (failure to thrive) in adult Weight loss, cognitive decline, unresponsive episode 12am to 1.20 am 01/09/13-mottling of arms and hands--POA aware of the event and came in to visit the patient--MOST feeding tube for a defined trial period with POA's approval. Supportive measures for now    Family/ Staff Communication: observe the patient.   Goals of Care: SNF--? Comfort measures given the  patient advanced age and gradual decline in setting of end stage of dementia.   Labs/tests ordered: BMP, BNP, CBC

## 2013-01-10 NOTE — Assessment & Plan Note (Signed)
New onset, rate controlled on Metoprolol 50mg  bid since 01/06/13 for elevated blood pressure. EKG 01/06/13 vent rate 102, Afib. ER evaluation: A-fib and CHF,  01/07/13:  EKG 01/07/13 vent rate 105, Afib. Furosemide 40mg  daily started, Metoprolol 50mg  x1 given while in ER, O2 @ 2lpm via [place] to maintain O2 sat >89%. BMP 01/09/13: Na 147, K 3.4, Bun 17, creat 0.97. Will decrease Lasix to 20mg  daily along with Kcl , f/u BMP and BNP

## 2013-01-12 LAB — CBC AND DIFFERENTIAL
HCT: 37 % (ref 36–46)
Platelets: 203 10*3/uL (ref 150–399)

## 2013-02-17 ENCOUNTER — Encounter: Payer: Self-pay | Admitting: Nurse Practitioner

## 2013-02-17 ENCOUNTER — Non-Acute Institutional Stay (SKILLED_NURSING_FACILITY): Payer: Medicare Other | Admitting: Nurse Practitioner

## 2013-02-17 DIAGNOSIS — F0390 Unspecified dementia without behavioral disturbance: Secondary | ICD-10-CM

## 2013-02-17 DIAGNOSIS — K5900 Constipation, unspecified: Secondary | ICD-10-CM

## 2013-02-17 DIAGNOSIS — I4891 Unspecified atrial fibrillation: Secondary | ICD-10-CM

## 2013-02-17 DIAGNOSIS — R627 Adult failure to thrive: Secondary | ICD-10-CM

## 2013-02-17 DIAGNOSIS — D509 Iron deficiency anemia, unspecified: Secondary | ICD-10-CM

## 2013-02-17 DIAGNOSIS — I5040 Unspecified combined systolic (congestive) and diastolic (congestive) heart failure: Secondary | ICD-10-CM

## 2013-02-17 NOTE — Assessment & Plan Note (Signed)
Hospice referral

## 2013-02-17 NOTE — Progress Notes (Signed)
Patient ID: [name] [name], female   DOB: Dec 07, 1914, 79 y.o.   MRN: [id number] Code Status: DNR  Allergies  Allergen Reactions  . Actonel (Risedronate Sodium)     Noted on MAR.  Marland Kitchen Fosamax (Alendronate Sodium)     Noted on MAR.  Marland Kitchen Lortab (Hydrocodone-Acetaminophen)     Noted on MAR.  . Masoprocol     Noted on MAR.  Marland Kitchen Morphine And Related     Noted on MAR.  Marland Kitchen Niacin And Related     Noted on MAR.  Marland Kitchen Phenergan (Promethazine Hcl)     Noted on MAR.  . Quinidine     Noted on MAR.  . Quinine Derivatives     Noted on MAR.    Chief Complaint  Patient presents with  . Medical Managment of Chronic Issues    hospice referral.     HPI: Patient is a 79 y.o. female seen in the SNF at [facility] today for evaluation of her chronic medical conditions.  Problem List Items Addressed This Visit   A-fib (Chronic)     New onset, rate controlled on Metoprolol 50mg  bid since 01/06/13 for elevated blood pressure. EKG 01/06/13 vent rate 102, Afib. ER evaluation: A-fib and CHF,  01/07/13:  EKG 01/07/13 vent rate 105, Afib. Furosemide 40mg  daily started and now reduced to 20mg ,  Metoprolol 50mg  x1 given while in ER, O2 @ 2lpm via [place] to maintain O2 sat >89%.BNP 142.6 01/12/13       CHF (congestive heart failure)     Will have Furosemide reduced to 20mg (from 40mg  started 01/07/13 in ER) due to Na 147, K 3.4--f/u Na and K normalized and clinically CHF is well compensated.       Dementia     SNF with intensive supervision, takes Namenda 28mg , declining rapidly in the past year        FTT (failure to thrive) in adult - Primary     Hospice referral.     Iron deficiency anemia     Resolved on Fe    Unspecified constipation     Managed with MiraLax and Senokot S II daily.          Review of Systems:  Review of Systems  Constitutional: Positive for weight loss and malaise/fatigue. Negative for fever, chills and diaphoresis.  HENT: Positive for hearing loss. Negative for congestion, sore  throat, neck pain and ear discharge.   Eyes: Negative for pain, discharge and redness.  Respiratory: Positive for cough. Negative for sputum production and wheezing.   Cardiovascular: Negative for chest pain, palpitations, orthopnea, claudication, leg swelling and PND.  Gastrointestinal: Negative for heartburn, nausea, vomiting, abdominal pain, diarrhea, constipation and blood in stool.  Genitourinary: Positive for frequency (incontinent of bladder). Negative for dysuria, urgency, hematuria and flank pain.  Musculoskeletal: Positive for joint pain and falls. Negative for myalgias and back pain.  Skin: Negative for itching and rash.       Lots of cutaneous horn and AKs  Neurological: Positive for weakness. Negative for dizziness, tingling, tremors, sensory change, speech change, focal weakness, seizures, loss of consciousness and headaches.  Endo/Heme/Allergies: Negative for environmental allergies and polydipsia. Does not bruise/bleed easily.  Psychiatric/Behavioral: Positive for memory loss. Negative for depression. The patient is not nervous/anxious and does not have insomnia.      Past Medical History  Diagnosis Date  . Hypertension   . Hyperlipidemia   . Cancer     uterine  . Skin cancer  basal, squamous cell - radiation  . Compression fx, thoracic spine 01/22/2008    fx T9 - on bedrest x 6 months  . Osteopenia   . Dementia   . Pancreatic mass approx. 2009    Past Surgical History  Procedure Laterality Date  . Abdominal hysterectomy      for ca   Social History:   reports that she has never smoked. She has never used smokeless tobacco. She reports that she does not drink alcohol or use illicit drugs.  History reviewed. No pertinent family history.  Medications: Patient's Medications  New Prescriptions   No medications on file  Previous Medications   ASPIRIN 81 MG CHEWABLE TABLET    Chew 81 mg by mouth daily.   CALCIUM CARBONATE-VITAMIN D (CALCIUM 600 + D PO)    Take  1 tablet by mouth daily.   CHOLECALCIFEROL (VITAMIN D) 1000 UNITS TABLET    Take 1,000 Units by mouth daily.   FERROUS SULFATE (FERROUSUL) 325 (65 FE) MG TABLET    Take 1 tablet (325 mg total) by mouth daily with breakfast.   FUROSEMIDE (LASIX) 40 MG TABLET    Take 1 tablet (40 mg total) by mouth daily. Start 01/08/13   MEMANTINE HCL ER (NAMENDA XR) 28 MG CP24    Take 28 mg by mouth daily.   METOPROLOL TARTRATE (LOPRESSOR) 25 MG TABLET    Take 25 mg by mouth 2 (two) times daily. Hold for BP < 90/60 or HR < 50.   NUTRITIONAL SUPPLEMENTS (BOOST PUDDING PO)    Take 1 Package by mouth 2 (two) times daily.   POLYETHYLENE GLYCOL (MIRALAX / GLYCOLAX) PACKET    Take 17 g by mouth every other day.   SENNA-DOCUSATE (SENOKOT-S) 8.6-50 MG PER TABLET    Take 2 tablets by mouth at bedtime.  Modified Medications   No medications on file  Discontinued Medications   No medications on file     Physical Exam:Physical Exam  Constitutional: She appears well-developed and well-nourished.  HENT:  Head: Normocephalic and atraumatic.  Eyes: Conjunctivae and EOM are normal. Pupils are equal, round, and reactive to light.  Neck: Normal range of motion. Neck supple. No JVD present. No thyromegaly present.  Cardiovascular: Normal rate and normal heart sounds.  An irregular rhythm present.  No murmur heard. Pulmonary/Chest: Effort normal. She has decreased breath sounds. She has no wheezes. She has no rales.  Abdominal: Soft. Bowel sounds are normal. There is no tenderness.  Musculoskeletal: Normal range of motion. She exhibits no edema and no tenderness.  Lymphadenopathy:    She has no cervical adenopathy.  Neurological: She is alert. She has normal reflexes. No cranial nerve deficit. She exhibits abnormal muscle tone. Coordination normal.  Skin: Skin is warm and dry.  Lots of AK or cutaneous horns  Psychiatric: Her mood appears not anxious. Her affect is not angry, not blunt, not labile and not inappropriate. Her  speech is delayed. Her speech is not slurred. She is slowed. She is not agitated, not aggressive, not hyperactive, not withdrawn, not actively hallucinating and not combative. Thought content is not paranoid and not delusional. Cognition and memory are impaired. She expresses impulsivity and inappropriate judgment. She does not exhibit a depressed mood. She exhibits abnormal recent memory and abnormal remote memory.    Filed Vitals:   02/17/13 1307  BP: 118/60  Pulse: 88  Temp: 97.2 F (36.2 C)  TempSrc: Tympanic  Resp: 18      Labs reviewed: Basic  Metabolic Panel:  Recent Labs  96/04/54 0510 06/17/12 0345 06/17/12 0401  11/14/12 01/07/13 0900 01/09/13 01/12/13  NA 136 138 141  < >  --  143 147 144  K 3.7 3.8 3.8  < >  --  4.1 3.4 3.6  CL 102 100 101  --   --  105  --   --   CO2 25 30  --   --   --  28  --   --   GLUCOSE 107* 115* 113*  --   --  109*  --   --   BUN 11 17 17   < >  --  14 17 20   CREATININE 0.71 0.86 1.00  < >  --  0.92 1.0 0.9  CALCIUM 9.2 10.0  --   --   --  9.6  --   --   TSH  --   --   --   --  2.24  --   --   --   < > = values in this interval not displayed. Liver Function Tests:  Recent Labs  06/17/12 0345 01/07/13 0900  AST 19 24  ALT 10 11  ALKPHOS 80 84  BILITOT 0.5 0.4  PROT 6.2 6.6  ALBUMIN 3.3* 3.2*    CBC:  Recent Labs  04/01/12 1757  04/03/12 0510 06/17/12 0345  11/10/12 01/07/13 0900 01/12/13  WBC 7.6  < > 9.7 11.8*  --  9.9 11.0* 8.5  NEUTROABS 4.5  --   --   --   --   --  9.2*  --   HGB 6.5*  < > 11.5* 11.4*  < > 12.5 13.5 12.2  HCT 21.2*  < > 35.1* 35.1*  < > 40 40.1 37  MCV 65.4*  < > 69.9* 81.6  --   --  83.4  --   PLT 378  < > 336 199  --  235 200 203  < > = values in this interval not displayed.   Recent Labs  04/02/12 0015  FOLATE 10.6  IRON 11*  VITAMINB12 673    Past Procedures:  01/06/13 EKG A-fib   Assessment/Plan FTT (failure to thrive) in adult Hospice referral.   Unspecified  constipation Managed with MiraLax and Senokot S II daily.     Iron deficiency anemia Resolved on Fe  CHF (congestive heart failure) Will have Furosemide reduced to 20mg (from 40mg  started 01/07/13 in ER) due to Na 147, K 3.4--f/u Na and K normalized and clinically CHF is well compensated.     Dementia SNF with intensive supervision, takes Namenda 28mg , declining rapidly in the past year      A-fib New onset, rate controlled on Metoprolol 50mg  bid since 01/06/13 for elevated blood pressure. EKG 01/06/13 vent rate 102, Afib. ER evaluation: A-fib and CHF,  01/07/13:  EKG 01/07/13 vent rate 105, Afib. Furosemide 40mg  daily started and now reduced to 20mg ,  Metoprolol 50mg  x1 given while in ER, O2 @ 2lpm via [place] to maintain O2 sat >89%.BNP 142.6 01/12/13       Family/ Staff Communication: observe the patient.   Goals of Care: SNF Hospice Service.  Labs/tests ordered: none

## 2013-02-17 NOTE — Assessment & Plan Note (Signed)
Managed with MiraLax and Senokot S II daily.

## 2013-02-17 NOTE — Assessment & Plan Note (Signed)
SNF with intensive supervision, takes Namenda 28mg , declining rapidly in the past year

## 2013-02-17 NOTE — Assessment & Plan Note (Signed)
Resolved on Fe

## 2013-02-17 NOTE — Assessment & Plan Note (Signed)
Will have Furosemide reduced to 20mg (from 40mg  started 01/07/13 in ER) due to Na 147, K 3.4--f/u Na and K normalized and clinically CHF is well compensated.

## 2013-02-17 NOTE — Assessment & Plan Note (Signed)
New onset, rate controlled on Metoprolol 50mg bid since 01/06/13 for elevated blood pressure. EKG 01/06/13 vent rate 102, Afib. ER evaluation: A-fib and CHF,  01/07/13:  EKG 01/07/13 vent rate 105, Afib. Furosemide 40mg daily started and now reduced to 20mg,  Metoprolol 50mg x1 given while in ER, O2 @ 2lpm via [place] to maintain O2 sat >89%.BNP 142.6 01/12/13.       

## 2013-02-24 ENCOUNTER — Non-Acute Institutional Stay (SKILLED_NURSING_FACILITY): Payer: Medicare Other | Admitting: Nurse Practitioner

## 2013-02-24 ENCOUNTER — Encounter: Payer: Self-pay | Admitting: Nurse Practitioner

## 2013-02-24 DIAGNOSIS — R627 Adult failure to thrive: Secondary | ICD-10-CM

## 2013-02-24 DIAGNOSIS — R000 Tachycardia, unspecified: Secondary | ICD-10-CM

## 2013-02-24 DIAGNOSIS — F0391 Unspecified dementia with behavioral disturbance: Secondary | ICD-10-CM

## 2013-02-24 DIAGNOSIS — K5900 Constipation, unspecified: Secondary | ICD-10-CM

## 2013-02-24 DIAGNOSIS — I5040 Unspecified combined systolic (congestive) and diastolic (congestive) heart failure: Secondary | ICD-10-CM

## 2013-02-24 NOTE — Progress Notes (Signed)
Patient ID: [name] [name], female   DOB: 12-14-1914, 79 y.o.   MRN: [id number]  Chief Complaint:  Chief Complaint  Patient presents with  . Medical Managment of Chronic Issues    end of life care     HPI:   Problem List Items Addressed This Visit   CHF (congestive heart failure) - Primary     Compensated on  Furosemide  20mg (from 40mg  started 01/07/13 in ER) and Kcl        Dementia     SNF with intensive supervision, dc Namenda 28mg , declining rapidly in the past year, Hospice service and comfort measures, no longer beneficial.           FTT (failure to thrive) in adult     Hospice Service and comfort care. Expected to decline.       Tachycardia (Chronic)     Better with Metoprolol to 50mg  bid, monitor HR. HR 90-100      Unspecified constipation     Managed with Senokot S II bid.            Review of Systems:  Review of Systems  Constitutional: Positive for weight loss and malaise/fatigue. Negative for fever, chills and diaphoresis.  HENT: Positive for hearing loss. Negative for congestion, sore throat, neck pain and ear discharge.   Eyes: Negative for pain, discharge and redness.  Respiratory: Positive for cough. Negative for sputum production and wheezing.   Cardiovascular: Negative for chest pain, palpitations, orthopnea, claudication, leg swelling and PND.  Gastrointestinal: Negative for heartburn, nausea, vomiting, abdominal pain, diarrhea, constipation and blood in stool.  Genitourinary: Positive for frequency (incontinent of bladder). Negative for dysuria, urgency, hematuria and flank pain.  Musculoskeletal: Positive for joint pain and falls. Negative for myalgias and back pain.  Skin: Negative for itching and rash.       Lots of cutaneous horn and AKs  Neurological: Positive for weakness. Negative for dizziness, tingling, tremors, sensory change, speech change, focal weakness, seizures, loss of consciousness and headaches.  Endo/Heme/Allergies:  Negative for environmental allergies and polydipsia. Does not bruise/bleed easily.  Psychiatric/Behavioral: Positive for memory loss. Negative for depression. The patient is not nervous/anxious and does not have insomnia.      Medications: Reviewed at [facility]   Physical Exam: Physical Exam  Constitutional: She appears well-developed and well-nourished.  HENT:  Head: Normocephalic and atraumatic.  Eyes: Conjunctivae and EOM are normal. Pupils are equal, round, and reactive to light.  Neck: Normal range of motion. Neck supple. No JVD present. No thyromegaly present.  Cardiovascular: Normal rate and normal heart sounds.  An irregular rhythm present.  No murmur heard. Pulmonary/Chest: Effort normal. She has decreased breath sounds. She has no wheezes. She has no rales.  Abdominal: Soft. Bowel sounds are normal. There is no tenderness.  Musculoskeletal: Normal range of motion. She exhibits no edema and no tenderness.  Lymphadenopathy:    She has no cervical adenopathy.  Neurological: She is alert. She has normal reflexes. No cranial nerve deficit. She exhibits normal muscle tone. Coordination normal.  Skin: Skin is warm and dry.  Lots of AK or cutaneous horns  Psychiatric: Her mood appears not anxious. Her affect is not angry, not blunt, not labile and not inappropriate. Her speech is delayed. Her speech is not slurred. She is slowed. She is not agitated, not aggressive, not hyperactive, not withdrawn, not actively hallucinating and not combative. Thought content is not paranoid and not delusional. Cognition and memory are impaired. She expresses  impulsivity and inappropriate judgment. She does not exhibit a depressed mood. She exhibits abnormal recent memory and abnormal remote memory.     Filed Vitals:   02/24/13 1553  BP: 108/62  Pulse: 100  Temp: 97.2 F (36.2 C)  TempSrc: Tympanic  Resp: 20      Labs reviewed: Basic Metabolic Panel:  Recent Labs  16/10/96 0510  06/17/12 0345 06/17/12 0401  11/14/12 01/07/13 0900 01/09/13 01/12/13  NA 136 138 141  < >  --  143 147 144  K 3.7 3.8 3.8  < >  --  4.1 3.4 3.6  CL 102 100 101  --   --  105  --   --   CO2 25 30  --   --   --  28  --   --   GLUCOSE 107* 115* 113*  --   --  109*  --   --   BUN 11 17 17   < >  --  14 17 20   CREATININE 0.71 0.86 1.00  < >  --  0.92 1.0 0.9  CALCIUM 9.2 10.0  --   --   --  9.6  --   --   TSH  --   --   --   --  2.24  --   --   --   < > = values in this interval not displayed.  Liver Function Tests:  Recent Labs  06/17/12 0345 01/07/13 0900  AST 19 24  ALT 10 11  ALKPHOS 80 84  BILITOT 0.5 0.4  PROT 6.2 6.6  ALBUMIN 3.3* 3.2*    CBC:  Recent Labs  04/01/12 1757  04/03/12 0510 06/17/12 0345  11/10/12 01/07/13 0900 01/12/13  WBC 7.6  < > 9.7 11.8*  --  9.9 11.0* 8.5  NEUTROABS 4.5  --   --   --   --   --  9.2*  --   HGB 6.5*  < > 11.5* 11.4*  < > 12.5 13.5 12.2  HCT 21.2*  < > 35.1* 35.1*  < > 40 40.1 37  MCV 65.4*  < > 69.9* 81.6  --   --  83.4  --   PLT 378  < > 336 199  --  235 200 203  < > = values in this interval not displayed.  Anemia Panel:  Recent Labs  04/02/12 0015  IRON 11*  FOLATE 10.6  VITAMINB12 673         Assessment/Plan CHF (congestive heart failure) Compensated on  Furosemide  20mg (from 40mg  started 01/07/13 in ER) and Kcl      Dementia SNF with intensive supervision, dc Namenda 28mg , declining rapidly in the past year, Hospice service and comfort measures, no longer beneficial.         Unspecified constipation Managed with Senokot S II bid.       Tachycardia Better with Metoprolol to 50mg  bid, monitor HR. HR 90-100    FTT (failure to thrive) in adult Hospice Service and comfort care. Expected to decline.         Family/ staff Communication: observe the patient.    Goals of care: SNF and Hospice Service   Labs/tests ordered none

## 2013-02-24 NOTE — Assessment & Plan Note (Signed)
Better with Metoprolol to 50mg bid, monitor HR. HR 90-100     

## 2013-02-24 NOTE — Assessment & Plan Note (Signed)
Managed with Senokot S II bid.    

## 2013-02-24 NOTE — Assessment & Plan Note (Signed)
SNF with intensive supervision, dc Namenda 28mg , declining rapidly in the past year, Hospice service and comfort measures, no longer beneficial.

## 2013-02-24 NOTE — Assessment & Plan Note (Addendum)
Hospice Service and comfort care. Expected to decline.

## 2013-02-24 NOTE — Assessment & Plan Note (Signed)
Compensated on  Furosemide  20mg (from 40mg  started 01/07/13 in ER) and Kcl 

## 2013-03-23 ENCOUNTER — Encounter: Payer: Self-pay | Admitting: Internal Medicine

## 2013-03-24 ENCOUNTER — Encounter: Payer: Self-pay | Admitting: Nurse Practitioner

## 2013-03-24 ENCOUNTER — Non-Acute Institutional Stay (SKILLED_NURSING_FACILITY): Payer: Medicare Other | Admitting: Nurse Practitioner

## 2013-03-24 DIAGNOSIS — I5033 Acute on chronic diastolic (congestive) heart failure: Secondary | ICD-10-CM

## 2013-03-24 DIAGNOSIS — K5900 Constipation, unspecified: Secondary | ICD-10-CM

## 2013-03-24 DIAGNOSIS — I4891 Unspecified atrial fibrillation: Secondary | ICD-10-CM

## 2013-03-24 DIAGNOSIS — I509 Heart failure, unspecified: Secondary | ICD-10-CM

## 2013-03-24 DIAGNOSIS — I10 Essential (primary) hypertension: Secondary | ICD-10-CM

## 2013-03-24 DIAGNOSIS — R000 Tachycardia, unspecified: Secondary | ICD-10-CM

## 2013-03-24 DIAGNOSIS — R627 Adult failure to thrive: Secondary | ICD-10-CM

## 2013-03-24 NOTE — Assessment & Plan Note (Signed)
Better with Metoprolol to 50mg  bid, monitor HR. HR 90-100

## 2013-03-24 NOTE — Assessment & Plan Note (Signed)
Compensated on  Furosemide  20mg(from 40mg started 01/07/13 in ER) and Kcl 10meq     

## 2013-03-24 NOTE — Progress Notes (Signed)
Patient ID: [name] [name], female   DOB: [birth year], 79 y.o.   MRN: [id number] Code Status: DNR  Allergies  Allergen Reactions  . Actonel [Risedronate Sodium]     Noted on MAR.  Marland Kitchen Fosamax [Alendronate Sodium]     Noted on MAR.  Marland Kitchen Lortab [Hydrocodone-Acetaminophen]     Noted on MAR.  . Masoprocol     Noted on MAR.  Marland Kitchen Morphine And Related     Noted on MAR.  Marland Kitchen Niacin And Related     Noted on MAR.  Marland Kitchen Phenergan [Promethazine Hcl]     Noted on MAR.  . Quinidine     Noted on MAR.  . Quinine Derivatives     Noted on MAR.    Chief Complaint  Patient presents with  . Medical Managment of Chronic Issues    HPI: Patient is a 79 y.o. female seen in the SNF at [facility] today for evaluation of FTT and other chronic medical conditions.  Problem List Items Addressed This Visit   A-fib (Chronic)     New onset, rate controlled on Metoprolol 50mg  bid since 01/06/13 for elevated blood pressure. EKG 01/06/13 vent rate 102, Afib. ER evaluation: A-fib and CHF,  01/07/13:  EKG 01/07/13 vent rate 105, Afib. Furosemide 40mg  daily started and now reduced to 20mg ,  Metoprolol 50mg  x1 given while in ER, O2 @ 2lpm via [place] to maintain O2 sat >89%.BNP 142.6 01/12/13.          CHF (congestive heart failure)     Compensated on  Furosemide  20mg (from 40mg  started 01/07/13 in ER) and Kcl          FTT (failure to thrive) in adult - Primary     Hospice Service and comfort care. Expected to decline. Weights down from 117 to 98 in the past 6 months.         Hypertension (Chronic)     Better controlled on Lasix and Metoprolol.       Tachycardia (Chronic)     Better with Metoprolol to 50mg  bid, monitor HR. HR 90-100        Unspecified constipation     Managed with Senokot S II bid.              Review of Systems:  Review of Systems  Constitutional: Positive for weight loss and malaise/fatigue. Negative for fever, chills and diaphoresis.  HENT: Positive for hearing loss.  Negative for congestion, sore throat, neck pain and ear discharge.   Eyes: Negative for pain, discharge and redness.  Respiratory: Positive for cough. Negative for sputum production and wheezing.   Cardiovascular: Negative for chest pain, palpitations, orthopnea, claudication, leg swelling and PND.  Gastrointestinal: Negative for heartburn, nausea, vomiting, abdominal pain, diarrhea, constipation and blood in stool.  Genitourinary: Positive for frequency (incontinent of bladder). Negative for dysuria, urgency, hematuria and flank pain.  Musculoskeletal: Positive for joint pain and falls. Negative for myalgias and back pain.  Skin: Negative for itching and rash.       Lots of cutaneous horn and AKs  Neurological: Positive for weakness. Negative for dizziness, tingling, tremors, sensory change, speech change, focal weakness, seizures, loss of consciousness and headaches.  Endo/Heme/Allergies: Negative for environmental allergies and polydipsia. Does not bruise/bleed easily.  Psychiatric/Behavioral: Positive for memory loss. Negative for depression. The patient is not nervous/anxious and does not have insomnia.      Past Medical History  Diagnosis Date  . Hypertension   . Hyperlipidemia   .  Cancer     uterine  . Skin cancer     basal, squamous cell - radiation  . Compression fx, thoracic spine 01/22/2008    fx T9 - on bedrest x 6 months  . Osteopenia   . Dementia   . Pancreatic mass approx. 2009    Past Surgical History  Procedure Laterality Date  . Abdominal hysterectomy      for ca   Social History:   reports that she has never smoked. She has never used smokeless tobacco. She reports that she does not drink alcohol or use illicit drugs.   Medications: Reviewed at [facility]   Physical Exam: Physical Exam  Constitutional: She appears well-developed and well-nourished.  HENT:  Head: Normocephalic and atraumatic.  Eyes: Conjunctivae and EOM are normal. Pupils are equal, round, and  reactive to light.  Neck: Normal range of motion. Neck supple. No JVD present. No thyromegaly present.  Cardiovascular: Normal rate and normal heart sounds.  An irregular rhythm present.  No murmur heard. Pulmonary/Chest: Effort normal. She has decreased breath sounds. She has no wheezes. She has no rales.  Abdominal: Soft. Bowel sounds are normal. There is no tenderness.  Musculoskeletal: Normal range of motion. She exhibits no edema and no tenderness.  Lymphadenopathy:    She has no cervical adenopathy.  Neurological: She is alert. She has normal reflexes. No cranial nerve deficit. She exhibits normal muscle tone. Coordination normal.  Skin: Skin is warm and dry.  Lots of AK or cutaneous horns  Psychiatric: Her mood appears not anxious. Her affect is not angry, not blunt, not labile and not inappropriate. Her speech is delayed. Her speech is not slurred. She is slowed. She is not agitated, not aggressive, not hyperactive, not withdrawn, not actively hallucinating and not combative. Thought content is not paranoid and not delusional. Cognition and memory are impaired. She expresses impulsivity and inappropriate judgment. She does not exhibit a depressed mood. She exhibits abnormal recent memory and abnormal remote memory.    Filed Vitals:   03/24/13 1621  BP: 132/75  Pulse: 84  Temp: 96.3 F (35.7 C)  TempSrc: Tympanic  Resp: 16      Labs reviewed: Basic Metabolic Panel:  Recent Labs  16/10/96 0510 06/17/12 0345 06/17/12 0401  11/14/12 01/07/13 0900 01/09/13 01/12/13  NA 136 138 141  < >  --  143 147 144  K 3.7 3.8 3.8  < >  --  4.1 3.4 3.6  CL 102 100 101  --   --  105  --   --   CO2 25 30  --   --   --  28  --   --   GLUCOSE 107* 115* 113*  --   --  109*  --   --   BUN 11 17 17   < >  --  14 17 20   CREATININE 0.71 0.86 1.00  < >  --  0.92 1.0 0.9  CALCIUM 9.2 10.0  --   --   --  9.6  --   --   TSH  --   --   --   --  2.24  --   --   --   < > = values in this interval  not displayed. Liver Function Tests:  Recent Labs  06/17/12 0345 01/07/13 0900  AST 19 24  ALT 10 11  ALKPHOS 80 84  BILITOT 0.5 0.4  PROT 6.2 6.6  ALBUMIN 3.3* 3.2*   CBC:  Recent Labs  04/01/12 1757  04/03/12 0510 06/17/12 0345  11/10/12 01/07/13 0900 01/12/13  WBC 7.6  < > 9.7 11.8*  --  9.9 11.0* 8.5  NEUTROABS 4.5  --   --   --   --   --  9.2*  --   HGB 6.5*  < > 11.5* 11.4*  < > 12.5 13.5 12.2  HCT 21.2*  < > 35.1* 35.1*  < > 40 40.1 37  MCV 65.4*  < > 69.9* 81.6  --   --  83.4  --   PLT 378  < > 336 199  --  235 200 203  < > = values in this interval not displayed. Assessment/Plan FTT (failure to thrive) in adult Hospice Service and comfort care. Expected to decline. Weights down from 117 to 98 in the past 6 months.       CHF (congestive heart failure) Compensated on  Furosemide  20mg (from 40mg  started 01/07/13 in ER) and Kcl        Hypertension Better controlled on Lasix and Metoprolol.     Tachycardia Better with Metoprolol to 50mg  bid, monitor HR. HR 90-100      A-fib New onset, rate controlled on Metoprolol 50mg  bid since 01/06/13 for elevated blood pressure. EKG 01/06/13 vent rate 102, Afib. ER evaluation: A-fib and CHF,  01/07/13:  EKG 01/07/13 vent rate 105, Afib. Furosemide 40mg  daily started and now reduced to 20mg ,  Metoprolol 50mg  x1 given while in ER, O2 @ 2lpm via [place] to maintain O2 sat >89%.BNP 142.6 01/12/13.        Unspecified constipation Managed with Senokot S II bid.           Family/ Staff Communication: observe the patient.   Goals of Care: SNF  Labs/tests ordered: none

## 2013-03-24 NOTE — Assessment & Plan Note (Signed)
Managed with Senokot S II bid.    

## 2013-03-24 NOTE — Assessment & Plan Note (Signed)
New onset, rate controlled on Metoprolol 50mg  bid since 01/06/13 for elevated blood pressure. EKG 01/06/13 vent rate 102, Afib. ER evaluation: A-fib and CHF,  01/07/13:  EKG 01/07/13 vent rate 105, Afib. Furosemide 40mg  daily started and now reduced to 20mg ,  Metoprolol 50mg  x1 given while in ER, O2 @ 2lpm via [place] to maintain O2 sat >89%.BNP 142.6 01/12/13.

## 2013-03-24 NOTE — Assessment & Plan Note (Signed)
Hospice Service and comfort care. Expected to decline. Weights down from 117 to 98 in the past 6 months.

## 2013-03-24 NOTE — Assessment & Plan Note (Signed)
Better controlled on Lasix and Metoprolol.

## 2013-04-11 ENCOUNTER — Non-Acute Institutional Stay (SKILLED_NURSING_FACILITY): Payer: Medicare Other | Admitting: Nurse Practitioner

## 2013-04-11 ENCOUNTER — Encounter: Payer: Self-pay | Admitting: Nurse Practitioner

## 2013-04-11 DIAGNOSIS — K5900 Constipation, unspecified: Secondary | ICD-10-CM

## 2013-04-11 DIAGNOSIS — I509 Heart failure, unspecified: Secondary | ICD-10-CM

## 2013-04-11 DIAGNOSIS — F0390 Unspecified dementia without behavioral disturbance: Secondary | ICD-10-CM

## 2013-04-11 DIAGNOSIS — R000 Tachycardia, unspecified: Secondary | ICD-10-CM

## 2013-04-11 NOTE — Progress Notes (Signed)
Patient ID: [name] [name], female   DOB: December 04, 1914, 79 y.o.   MRN: [id number] Code Status: DNR  Allergies  Allergen Reactions  . Actonel [Risedronate Sodium]     Noted on MAR.  Marland Kitchen Fosamax [Alendronate Sodium]     Noted on MAR.  Marland Kitchen Lortab [Hydrocodone-Acetaminophen]     Noted on MAR.  . Masoprocol     Noted on MAR.  Marland Kitchen Morphine And Related     Noted on MAR.  Marland Kitchen Niacin And Related     Noted on MAR.  Marland Kitchen Phenergan [Promethazine Hcl]     Noted on MAR.  . Quinidine     Noted on MAR.  . Quinine Derivatives     Noted on MAR.    Chief Complaint  Patient presents with  . Medical Managment of Chronic Issues    HPI: Patient is a 79 y.o. female seen in the SNF at [facility] today for evaluation of CHF and other chronic medical conditions.      Problem List Items Addressed This Visit   CHF (congestive heart failure)     Compensated on  Furosemide  20mg (from 40mg  started 01/07/13 in ER) and Kcl , update BMP            Relevant Medications      furosemide (LASIX) 40 MG tablet   Dementia     SNF with intensive supervision, dc'd Namenda 28mg , declining rapidly in the past year, Hospice service and comfort measures, no longer beneficial.             Tachycardia (Chronic)     Better with Metoprolol to 50mg  bid, monitor HR. HR 90-100          Unspecified constipation - Primary     Managed with Senokot S II bid, DulcoLax suppository 10mg  PR qod prn, Amitiza dialy.                Review of Systems:  Review of Systems  Constitutional: Positive for weight loss and malaise/fatigue. Negative for fever, chills and diaphoresis.  HENT: Positive for hearing loss. Negative for congestion, sore throat, neck pain and ear discharge.   Eyes: Negative for pain, discharge and redness.  Respiratory: Positive for cough. Negative for sputum production and wheezing.   Cardiovascular: Negative for chest pain, palpitations, orthopnea, claudication, leg swelling  and PND.  Gastrointestinal: Negative for heartburn, nausea, vomiting, abdominal pain, diarrhea, constipation and blood in stool.  Genitourinary: Positive for frequency (incontinent of bladder). Negative for dysuria, urgency, hematuria and flank pain.  Musculoskeletal: Positive for joint pain and falls. Negative for myalgias and back pain.  Skin: Negative for itching and rash.       Lots of cutaneous horn and AKs  Neurological: Positive for weakness. Negative for dizziness, tingling, tremors, sensory change, speech change, focal weakness, seizures, loss of consciousness and headaches.  Endo/Heme/Allergies: Negative for environmental allergies and polydipsia. Does not bruise/bleed easily.  Psychiatric/Behavioral: Positive for memory loss. Negative for depression. The patient is not nervous/anxious and does not have insomnia.      Past Medical History  Diagnosis Date  . Hypertension   . Hyperlipidemia   . Cancer     uterine  . Skin cancer     basal, squamous cell - radiation  . Compression fx, thoracic spine 01/22/2008    fx T9 - on bedrest x 6 months  . Osteopenia   . Dementia   . Pancreatic mass approx. 2009    Past Surgical History  Procedure Laterality Date  . Abdominal hysterectomy      for ca   Social History:   reports that she has never smoked. She has never used smokeless tobacco. She reports that she does not drink alcohol or use illicit drugs.   Medications:   Medication List       This list is accurate as of: 04/11/13 10:25 AM.  Always use your most recent med list.               acetaminophen 325 MG tablet  Commonly known as:  TYLENOL  Take 650 mg by mouth every 4 (four) hours as needed for pain.     bisacodyl 10 MG suppository  Commonly known as:  DULCOLAX  Place 10 mg rectally as needed for constipation. Every other day     BOOST PUDDING PO  Take 1 Package by mouth 2 (two) times daily.     furosemide 40 MG tablet  Commonly known as:  LASIX  Take 20  mg by mouth daily. Start 01/08/13     lubiprostone 24 MCG capsule  Commonly known as:  AMITIZA  Take 24 mcg by mouth daily with breakfast.     metoprolol tartrate 25 MG tablet  Commonly known as:  LOPRESSOR  Take 50 mg by mouth 2 (two) times daily. Hold for BP < 90/60 or HR < 50.     potassium chloride 10 MEQ tablet  Commonly known as:  K-DUR,KLOR-CON  Take 10 mEq by mouth daily.     senna-docusate 8.6-50 MG per tablet  Commonly known as:  Senokot-S  Take 2 tablets by mouth 2 (two) times daily.          Physical Exam: Physical Exam  Constitutional: She appears well-developed and well-nourished.  HENT:  Head: Normocephalic and atraumatic.  Eyes: Conjunctivae and EOM are normal. Pupils are equal, round, and reactive to light.  Neck: Normal range of motion. Neck supple. No JVD present. No thyromegaly present.  Cardiovascular: Normal rate and normal heart sounds.  An irregular rhythm present.  No murmur heard. Pulmonary/Chest: Effort normal. She has decreased breath sounds. She has no wheezes. She has no rales.  Abdominal: Soft. Bowel sounds are normal. There is no tenderness.  Musculoskeletal: Normal range of motion. She exhibits no edema and no tenderness.  Lymphadenopathy:    She has no cervical adenopathy.  Neurological: She is alert. She has normal reflexes. No cranial nerve deficit. She exhibits normal muscle tone. Coordination normal.  Skin: Skin is warm and dry.  Lots of AK or cutaneous horns  Psychiatric: Her mood appears not anxious. Her affect is not angry, not blunt, not labile and not inappropriate. Her speech is delayed. Her speech is not slurred. She is slowed. She is not agitated, not aggressive, not hyperactive, not withdrawn, not actively hallucinating and not combative. Thought content is not paranoid and not delusional. Cognition and memory are impaired. She expresses impulsivity and inappropriate judgment. She does not exhibit a depressed mood. She exhibits  abnormal recent memory and abnormal remote memory.    Filed Vitals:   04/11/13 1009  BP: 120/70  Pulse: 84  Temp: 98.5 F (36.9 C)  TempSrc: Tympanic  Resp: 18      Labs reviewed: Basic Metabolic Panel:  Recent Labs  47/82/95 0345 06/17/12 0401  11/14/12 01/07/13 0900 01/09/13 01/12/13  NA 138 141  < >  --  143 147 144  K 3.8 3.8  < >  --  4.1 3.4 3.6  CL 100 101  --   --  105  --   --   CO2 30  --   --   --  28  --   --   GLUCOSE 115* 113*  --   --  109*  --   --   BUN 17 17  < >  --  14 17 20   CREATININE 0.86 1.00  < >  --  0.92 1.0 0.9  CALCIUM 10.0  --   --   --  9.6  --   --   TSH  --   --   --  2.24  --   --   --   < > = values in this interval not displayed. Liver Function Tests:  Recent Labs  06/17/12 0345 01/07/13 0900  AST 19 24  ALT 10 11  ALKPHOS 80 84  BILITOT 0.5 0.4  PROT 6.2 6.6  ALBUMIN 3.3* 3.2*   CBC:  Recent Labs  06/17/12 0345  11/10/12 01/07/13 0900 01/12/13  WBC 11.8*  --  9.9 11.0* 8.5  NEUTROABS  --   --   --  9.2*  --   HGB 11.4*  < > 12.5 13.5 12.2  HCT 35.1*  < > 40 40.1 37  MCV 81.6  --   --  83.4  --   PLT 199  --  235 200 203  < > = values in this interval not displayed. Assessment/Plan Unspecified constipation Managed with Senokot S II bid, DulcoLax suppository 10mg  PR qod prn, Amitiza dialy.           Tachycardia Better with Metoprolol to 50mg  bid, monitor HR. HR 90-100        CHF (congestive heart failure) Compensated on  Furosemide  20mg (from 40mg  started 01/07/13 in ER) and Kcl , update BMP          Dementia SNF with intensive supervision, dc'd Namenda 28mg , declining rapidly in the past year, Hospice service and comfort measures, no longer beneficial.             Family/ Staff Communication: observe the patient.   Goals of Care: SNF  Labs/tests ordered: BMP

## 2013-04-11 NOTE — Assessment & Plan Note (Signed)
Managed with Senokot S II bid, DulcoLax suppository 10mg  PR qod prn, Amitiza dialy.

## 2013-04-11 NOTE — Assessment & Plan Note (Signed)
SNF with intensive supervision, dc'd Namenda 28mg , declining rapidly in the past year, Hospice service and comfort measures, no longer beneficial.

## 2013-04-11 NOTE — Assessment & Plan Note (Addendum)
Compensated on  Furosemide  20mg(from 40mg started 01/07/13 in ER) and Kcl 10meq, update BMP           

## 2013-04-11 NOTE — Assessment & Plan Note (Signed)
Better with Metoprolol to 50mg bid, monitor HR. HR 90-100     

## 2013-04-13 LAB — BASIC METABOLIC PANEL
BUN: 14 mg/dL (ref 4–21)
Glucose: 101 mg/dL
Potassium: 3.5 mmol/L (ref 3.4–5.3)
Sodium: 142 mmol/L (ref 137–147)

## 2013-04-18 ENCOUNTER — Non-Acute Institutional Stay (SKILLED_NURSING_FACILITY): Payer: Medicare Other | Admitting: Nurse Practitioner

## 2013-04-18 ENCOUNTER — Encounter: Payer: Self-pay | Admitting: Nurse Practitioner

## 2013-04-18 DIAGNOSIS — I10 Essential (primary) hypertension: Secondary | ICD-10-CM

## 2013-04-18 DIAGNOSIS — I4891 Unspecified atrial fibrillation: Secondary | ICD-10-CM

## 2013-04-18 DIAGNOSIS — I509 Heart failure, unspecified: Secondary | ICD-10-CM

## 2013-04-18 DIAGNOSIS — R000 Tachycardia, unspecified: Secondary | ICD-10-CM

## 2013-04-18 NOTE — Assessment & Plan Note (Signed)
Compensated on  Furosemide  20mg (from 40mg  started 01/07/13 in ER) and Kcl , update BMP

## 2013-04-18 NOTE — Assessment & Plan Note (Signed)
Low measurements, takes Lasix 20mg  and decrease Metoprolol to 25mg  bid. Monitor Bp.

## 2013-04-18 NOTE — Assessment & Plan Note (Signed)
New onset, rate controlled on Metoprolol 50mg  bid since 01/06/13-reduced to 25mg  bid 04/15/13 for low Bps. EKG 01/06/13 vent rate 102, Afib. ER evaluation: A-fib and CHF,  01/07/13:  EKG 01/07/13 vent rate 105, Afib. Furosemide 20mg  daily and   Metoprolol 50mg  x1 given while in ER, O2 @ 2lpm via [place] to maintain O2 sat >89%.BNP 142.6 01/12/13.

## 2013-04-18 NOTE — Assessment & Plan Note (Signed)
Better with Metoprolol to 50mg  bid, monitor HR. HR 90-100--decrease Metoprolol to 25mg  bid due to Bp: 96/68, 105/73, 106/52--will monitor AP

## 2013-04-18 NOTE — Progress Notes (Signed)
Patient ID: [name] [name], female   DOB: May 05, 1915, 79 y.o.   MRN: [id number] Code Status: DNR  Allergies  Allergen Reactions  . Actonel [Risedronate Sodium]     Noted on MAR.  Marland Kitchen Fosamax [Alendronate Sodium]     Noted on MAR.  Marland Kitchen Lortab [Hydrocodone-Acetaminophen]     Noted on MAR.  . Masoprocol     Noted on MAR.  Marland Kitchen Morphine And Related     Noted on MAR.  Marland Kitchen Niacin And Related     Noted on MAR.  Marland Kitchen Phenergan [Promethazine Hcl]     Noted on MAR.  . Quinidine     Noted on MAR.  . Quinine Derivatives     Noted on MAR.    Chief Complaint  Patient presents with  . Medical Managment of Chronic Issues    low Bps    HPI: Patient is a 79 y.o. female seen in the SNF at [facility] today for evaluation of lower blood pressures and other chronic medical conditions.      Problem List Items Addressed This Visit   A-fib (Chronic)     New onset, rate controlled on Metoprolol 50mg  bid since 01/06/13-reduced to 25mg  bid 04/15/13 for low Bps. EKG 01/06/13 vent rate 102, Afib. ER evaluation: A-fib and CHF,  01/07/13:  EKG 01/07/13 vent rate 105, Afib. Furosemide 20mg  daily and   Metoprolol 50mg  x1 given while in ER, O2 @ 2lpm via [place] to maintain O2 sat >89%.BNP 142.6 01/12/13.            CHF (congestive heart failure)     Compensated on  Furosemide  20mg (from 40mg  started 01/07/13 in ER) and Kcl , update BMP              Hypertension (Chronic)     Low measurements, takes Lasix 20mg  and decrease Metoprolol to 25mg  bid. Monitor Bp.         Tachycardia - Primary (Chronic)     Better with Metoprolol to 50mg  bid, monitor HR. HR 90-100--decrease Metoprolol to 25mg  bid due to Bp: 96/68, 105/73, 106/52--will monitor AP               Review of Systems:  Review of Systems  Constitutional: Positive for weight loss and malaise/fatigue. Negative for fever, chills and diaphoresis.  HENT: Positive for hearing loss. Negative for congestion, sore throat, neck pain and ear  discharge.   Eyes: Negative for pain, discharge and redness.  Respiratory: Positive for cough. Negative for sputum production and wheezing.   Cardiovascular: Negative for chest pain, palpitations, orthopnea, claudication, leg swelling and PND.  Gastrointestinal: Negative for heartburn, nausea, vomiting, abdominal pain, diarrhea, constipation and blood in stool.  Genitourinary: Positive for frequency (incontinent of bladder). Negative for dysuria, urgency, hematuria and flank pain.  Musculoskeletal: Positive for joint pain and falls. Negative for myalgias and back pain.  Skin: Negative for itching and rash.       Lots of cutaneous horn and AKs  Neurological: Positive for weakness. Negative for dizziness, tingling, tremors, sensory change, speech change, focal weakness, seizures, loss of consciousness and headaches.  Endo/Heme/Allergies: Negative for environmental allergies and polydipsia. Does not bruise/bleed easily.  Psychiatric/Behavioral: Positive for memory loss. Negative for depression. The patient is not nervous/anxious and does not have insomnia.      Past Medical History  Diagnosis Date  . Hypertension   . Hyperlipidemia   . Cancer     uterine  . Skin cancer  basal, squamous cell - radiation  . Compression fx, thoracic spine 01/22/2008    fx T9 - on bedrest x 6 months  . Osteopenia   . Dementia   . Pancreatic mass approx. 2009    Past Surgical History  Procedure Laterality Date  . Abdominal hysterectomy      for ca   Social History:   reports that she has never smoked. She has never used smokeless tobacco. She reports that she does not drink alcohol or use illicit drugs.   Medications:   Medication List       This list is accurate as of: 04/18/13  2:48 PM.  Always use your most recent med list.               acetaminophen 325 MG tablet  Commonly known as:  TYLENOL  Take 650 mg by mouth every 4 (four) hours as needed for pain.     bisacodyl 10 MG  suppository  Commonly known as:  DULCOLAX  Place 10 mg rectally as needed for constipation. Every other day     BOOST PUDDING PO  Take 1 Package by mouth 2 (two) times daily.     furosemide 40 MG tablet  Commonly known as:  LASIX  Take 20 mg by mouth daily. Start 01/08/13     lubiprostone 24 MCG capsule  Commonly known as:  AMITIZA  Take 24 mcg by mouth daily with breakfast.     metoprolol tartrate 25 MG tablet  Commonly known as:  LOPRESSOR  Take 25 mg by mouth 2 (two) times daily. Hold for BP < 90/60 or HR < 50.     potassium chloride 10 MEQ tablet  Commonly known as:  K-DUR,KLOR-CON  Take 10 mEq by mouth daily.     senna-docusate 8.6-50 MG per tablet  Commonly known as:  Senokot-S  Take 2 tablets by mouth 2 (two) times daily.          Physical Exam: Physical Exam  Constitutional: She appears well-developed and well-nourished.  HENT:  Head: Normocephalic and atraumatic.  Eyes: Conjunctivae and EOM are normal. Pupils are equal, round, and reactive to light.  Neck: Normal range of motion. Neck supple. No JVD present. No thyromegaly present.  Cardiovascular: Normal rate and normal heart sounds.  An irregular rhythm present.  No murmur heard. Pulmonary/Chest: Effort normal. She has decreased breath sounds. She has no wheezes. She has no rales.  Abdominal: Soft. Bowel sounds are normal. There is no tenderness.  Musculoskeletal: Normal range of motion. She exhibits no edema and no tenderness.  Lymphadenopathy:    She has no cervical adenopathy.  Neurological: She is alert. She has normal reflexes. No cranial nerve deficit. She exhibits normal muscle tone. Coordination normal.  Skin: Skin is warm and dry.  Lots of AK or cutaneous horns  Psychiatric: Her mood appears not anxious. Her affect is not angry, not blunt, not labile and not inappropriate. Her speech is delayed. Her speech is not slurred. She is slowed. She is not agitated, not aggressive, not hyperactive, not  withdrawn, not actively hallucinating and not combative. Thought content is not paranoid and not delusional. Cognition and memory are impaired. She expresses impulsivity and inappropriate judgment. She does not exhibit a depressed mood. She exhibits abnormal recent memory and abnormal remote memory.    Filed Vitals:   04/18/13 1434  BP: 134/86  Pulse: 98  Temp: 98 F (36.7 C)  TempSrc: Tympanic  Resp: 18  Labs reviewed: Basic Metabolic Panel:  Recent Labs  54/09/81 0345 06/17/12 0401  11/14/12 01/07/13 0900 01/09/13 01/12/13 04/13/13  NA 138 141  < >  --  143 147 144 142  K 3.8 3.8  < >  --  4.1 3.4 3.6 3.5  CL 100 101  --   --  105  --   --   --   CO2 30  --   --   --  28  --   --   --   GLUCOSE 115* 113*  --   --  109*  --   --   --   BUN 17 17  < >  --  14 17 20 14   CREATININE 0.86 1.00  < >  --  0.92 1.0 0.9 0.8  CALCIUM 10.0  --   --   --  9.6  --   --   --   TSH  --   --   --  2.24  --   --   --   --   < > = values in this interval not displayed. Liver Function Tests:  Recent Labs  06/17/12 0345 01/07/13 0900  AST 19 24  ALT 10 11  ALKPHOS 80 84  BILITOT 0.5 0.4  PROT 6.2 6.6  ALBUMIN 3.3* 3.2*   CBC:  Recent Labs  06/17/12 0345  11/10/12 01/07/13 0900 01/12/13  WBC 11.8*  --  9.9 11.0* 8.5  NEUTROABS  --   --   --  9.2*  --   HGB 11.4*  < > 12.5 13.5 12.2  HCT 35.1*  < > 40 40.1 37  MCV 81.6  --   --  83.4  --   PLT 199  --  235 200 203  < > = values in this interval not displayed. Assessment/Plan Tachycardia Better with Metoprolol to 50mg  bid, monitor HR. HR 90-100--decrease Metoprolol to 25mg  bid due to Bp: 96/68, 105/73, 106/52--will monitor AP          Hypertension Low measurements, takes Lasix 20mg  and decrease Metoprolol to 25mg  bid. Monitor Bp.       CHF (congestive heart failure) Compensated on  Furosemide  20mg (from 40mg  started 01/07/13 in ER) and Kcl , update BMP            A-fib New onset, rate  controlled on Metoprolol 50mg  bid since 01/06/13-reduced to 25mg  bid 04/15/13 for low Bps. EKG 01/06/13 vent rate 102, Afib. ER evaluation: A-fib and CHF,  01/07/13:  EKG 01/07/13 vent rate 105, Afib. Furosemide 20mg  daily and   Metoprolol 50mg  x1 given while in ER, O2 @ 2lpm via [place] to maintain O2 sat >89%.BNP 142.6 01/12/13.            Family/ Staff Communication: observe the patient.   Goals of Care: SNF  Labs/tests ordered: none

## 2013-05-10 DEATH — deceased

## 2013-08-20 IMAGING — CR DG CHEST 2V
1 series · 1 of 1 positions shown · non-contrast
Comparison: 06/17/2012

CLINICAL DATA: Cough and low oxygen saturation

CHEST - 2 VIEW

[w chest lat]
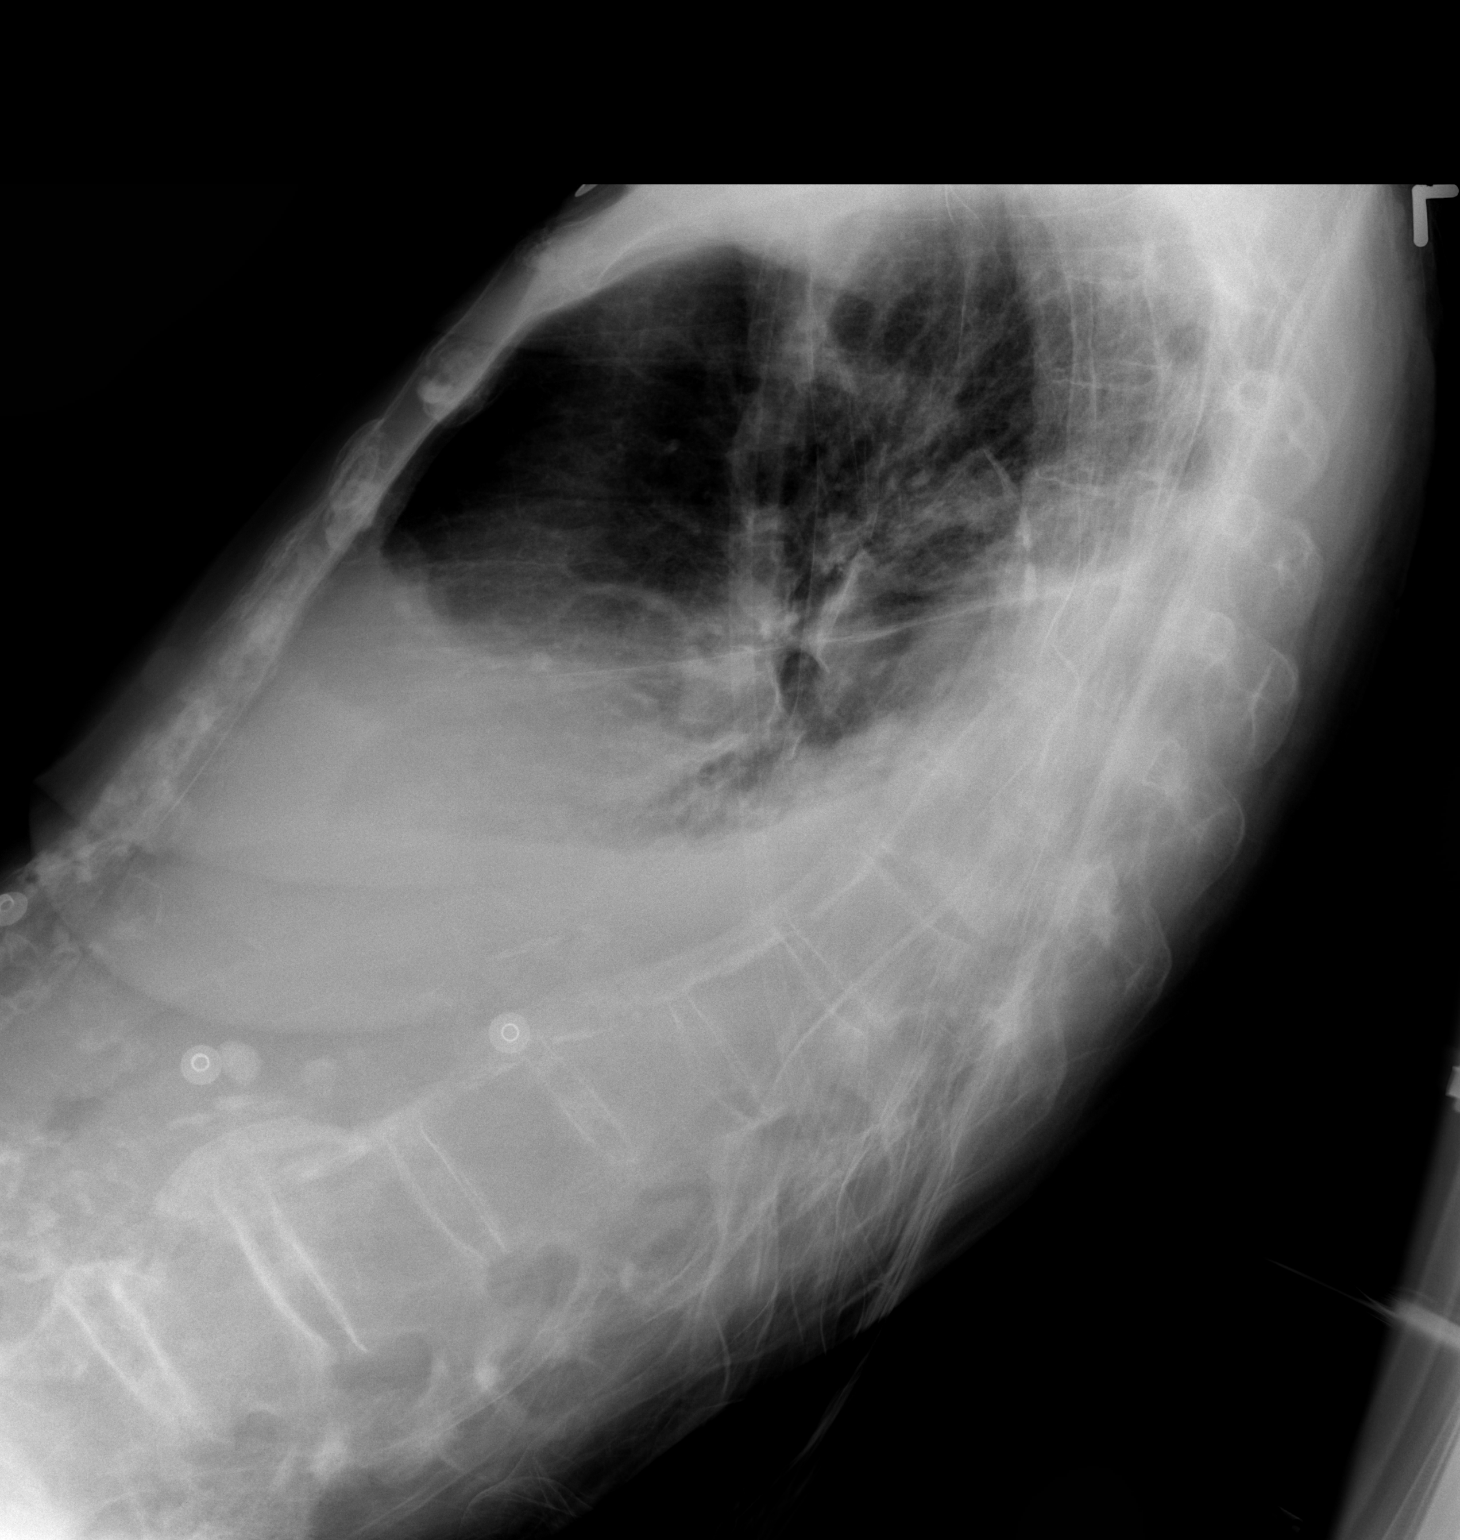

[1 of 1 positions shown; findings below may reference images not displayed]

FINDINGS: There are moderate to large right and moderate left
pleural effusions obscuring hemidiaphragms. There is associated
dependent lung opacity, most likely compressive atelectasis.
Central vascular congestion is noted.  No pneumothorax.  The
cardiac silhouette is normal in overall size.  No mediastinal or
hilar masses.
IMPRESSION: Increased pleural effusions, moderate to large on the right and
moderate on the left, with central vascular congestion.  There is
dependent lung opacity adjacent to the pleural effusions that is
likely compressive atelectasis.  Infiltrate is possible.

## 2015-06-20 NOTE — Progress Notes (Signed)
This encounter was created in error - please disregard.
